# Patient Record
Sex: Female | Born: 1985 | Race: Black or African American | Hispanic: No | Marital: Single | Smoking: Never smoker
Health system: Southern US, Community
[De-identification: ages and names within clinical notes are randomized; demographics above are authoritative.]

## PROBLEM LIST (undated history)

## (undated) ENCOUNTER — Inpatient Hospital Stay (HOSPITAL_COMMUNITY): Payer: Self-pay

## (undated) DIAGNOSIS — I639 Cerebral infarction, unspecified: Secondary | ICD-10-CM

## (undated) DIAGNOSIS — Z789 Other specified health status: Secondary | ICD-10-CM

## (undated) HISTORY — PX: NO PAST SURGERIES: SHX2092

---

## 2012-08-26 ENCOUNTER — Other Ambulatory Visit: Payer: Self-pay | Admitting: Orthopedic Surgery

## 2012-08-26 DIAGNOSIS — S82102A Unspecified fracture of upper end of left tibia, initial encounter for closed fracture: Secondary | ICD-10-CM

## 2012-08-31 ENCOUNTER — Ambulatory Visit
Admission: RE | Admit: 2012-08-31 | Discharge: 2012-08-31 | Disposition: A | Discharge status: Home / Self Care | Payer: Managed Care, Other (non HMO) | Source: Ambulatory Visit | Attending: Orthopedic Surgery | Admitting: Orthopedic Surgery

## 2012-08-31 DIAGNOSIS — S82102A Unspecified fracture of upper end of left tibia, initial encounter for closed fracture: Secondary | ICD-10-CM
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
## 2014-02-10 ENCOUNTER — Emergency Department (HOSPITAL_COMMUNITY)
Admission: EM | Admit: 2014-02-10 | Discharge: 2014-02-10 | Disposition: A | Discharge status: Home / Self Care | Payer: Managed Care, Other (non HMO) | Attending: Emergency Medicine | Admitting: Emergency Medicine

## 2014-02-10 ENCOUNTER — Encounter (HOSPITAL_COMMUNITY): Payer: Self-pay | Admitting: Emergency Medicine

## 2014-02-10 ENCOUNTER — Emergency Department (HOSPITAL_COMMUNITY): Payer: Managed Care, Other (non HMO)

## 2014-02-10 DIAGNOSIS — S199XXA Unspecified injury of neck, initial encounter: Secondary | ICD-10-CM

## 2014-02-10 DIAGNOSIS — Z87828 Personal history of other (healed) physical injury and trauma: Secondary | ICD-10-CM | POA: Insufficient documentation

## 2014-02-10 DIAGNOSIS — S0993XA Unspecified injury of face, initial encounter: Secondary | ICD-10-CM | POA: Insufficient documentation

## 2014-02-10 DIAGNOSIS — Y92410 Unspecified street and highway as the place of occurrence of the external cause: Secondary | ICD-10-CM | POA: Insufficient documentation

## 2014-02-10 DIAGNOSIS — S20219A Contusion of unspecified front wall of thorax, initial encounter: Secondary | ICD-10-CM | POA: Insufficient documentation

## 2014-02-10 DIAGNOSIS — S298XXA Other specified injuries of thorax, initial encounter: Secondary | ICD-10-CM | POA: Insufficient documentation

## 2014-02-10 DIAGNOSIS — Z79899 Other long term (current) drug therapy: Secondary | ICD-10-CM | POA: Insufficient documentation

## 2014-02-10 DIAGNOSIS — T148XXA Other injury of unspecified body region, initial encounter: Secondary | ICD-10-CM

## 2014-02-10 DIAGNOSIS — Y9389 Activity, other specified: Secondary | ICD-10-CM | POA: Insufficient documentation

## 2014-02-10 DIAGNOSIS — S20212A Contusion of left front wall of thorax, initial encounter: Secondary | ICD-10-CM

## 2014-02-10 DIAGNOSIS — S8000XA Contusion of unspecified knee, initial encounter: Secondary | ICD-10-CM | POA: Insufficient documentation
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
## 2014-02-10 MED ORDER — MELOXICAM 15 MG PO TABS: 15.0000 mg | ORAL_TABLET | Freq: Every day | ORAL | Status: DC

## 2014-02-10 MED ORDER — HYDROCODONE-ACETAMINOPHEN 5-325 MG PO TABS: 1.0000 | ORAL_TABLET | ORAL | Status: DC | PRN

## 2014-02-10 MED ORDER — CYCLOBENZAPRINE HCL 10 MG PO TABS: 10.0000 mg | ORAL_TABLET | Freq: Three times a day (TID) | ORAL | Status: DC | PRN

## 2014-02-10 MED ORDER — HYDROCODONE-ACETAMINOPHEN 5-325 MG PO TABS
1.0000 | ORAL_TABLET | Freq: Once | ORAL | Status: AC
  Administered 2014-02-10: 1 via ORAL
  Filled 2014-02-10: qty 1
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
## 2014-02-10 NOTE — ED Notes (Addendum)
Patient in Xray
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
## 2014-02-11 NOTE — ED Provider Notes (Signed)
Medical screening examination/treatment/procedure(s) were performed by non-physician practitioner and as supervising physician I was immediately available for consultation/collaboration.   EKG Interpretation None       Dametra Whetsel, MD 02/11/14 0050 

## 2014-06-25 ENCOUNTER — Encounter (HOSPITAL_COMMUNITY): Payer: Self-pay | Admitting: *Deleted

## 2014-06-25 ENCOUNTER — Inpatient Hospital Stay (HOSPITAL_COMMUNITY)
Admission: AD | Admit: 2014-06-25 | Discharge: 2014-06-25 | Disposition: A | Discharge status: Home / Self Care | Payer: BC Managed Care – PPO | Source: Ambulatory Visit | Attending: Family Medicine | Admitting: Family Medicine

## 2014-06-25 ENCOUNTER — Inpatient Hospital Stay (HOSPITAL_COMMUNITY): Payer: BC Managed Care – PPO

## 2014-06-25 DIAGNOSIS — O210 Mild hyperemesis gravidarum: Secondary | ICD-10-CM | POA: Diagnosis not present

## 2014-06-25 DIAGNOSIS — R109 Unspecified abdominal pain: Secondary | ICD-10-CM

## 2014-06-25 DIAGNOSIS — N949 Unspecified condition associated with female genital organs and menstrual cycle: Secondary | ICD-10-CM | POA: Diagnosis not present

## 2014-06-25 DIAGNOSIS — O211 Hyperemesis gravidarum with metabolic disturbance: Secondary | ICD-10-CM | POA: Insufficient documentation

## 2014-06-25 DIAGNOSIS — Z3A01 Less than 8 weeks gestation of pregnancy: Secondary | ICD-10-CM | POA: Diagnosis not present

## 2014-06-25 DIAGNOSIS — E860 Dehydration: Secondary | ICD-10-CM

## 2014-06-25 DIAGNOSIS — Z3490 Encounter for supervision of normal pregnancy, unspecified, unspecified trimester: Secondary | ICD-10-CM

## 2014-06-25 HISTORY — DX: Other specified health status: Z78.9

## 2014-06-25 LAB — COMPREHENSIVE METABOLIC PANEL
ALT: 44 U/L — ABNORMAL HIGH (ref 0–35)
AST: 39 U/L — ABNORMAL HIGH (ref 0–37)
Albumin: 4.4 g/dL (ref 3.5–5.2)
Alkaline Phosphatase: 36 U/L — ABNORMAL LOW (ref 39–117)
Anion gap: 9 (ref 5–15)
BILIRUBIN TOTAL: 0.5 mg/dL (ref 0.3–1.2)
BUN: 9 mg/dL (ref 6–23)
CO2: 22 mmol/L (ref 19–32)
CREATININE: 0.62 mg/dL (ref 0.50–1.10)
Calcium: 9.8 mg/dL (ref 8.4–10.5)
Chloride: 104 mEq/L (ref 96–112)
GFR calc Af Amer: >90 mL/min (ref >90)
Glucose, Bld: 76 mg/dL (ref 70–99)
POTASSIUM: 4 mmol/L (ref 3.5–5.1)
Sodium: 135 mmol/L (ref 135–145)
Total Protein: 7.7 g/dL (ref 6.0–8.3)

## 2014-06-25 LAB — URINE MICROSCOPIC-ADD ON

## 2014-06-25 LAB — URINALYSIS, ROUTINE W REFLEX MICROSCOPIC
Glucose, UA: NEGATIVE mg/dL
HGB URINE DIPSTICK: NEGATIVE
Ketones, ur: >80 mg/dL — AB
Leukocytes, UA: NEGATIVE
Nitrite: NEGATIVE
Protein, ur: 30 mg/dL — AB
Urobilinogen, UA: 1 mg/dL (ref 0.0–1.0)
pH: 6 (ref 5.0–8.0)

## 2014-06-25 LAB — CBC
HEMATOCRIT: 40.9 % (ref 36.0–46.0)
Hemoglobin: 13.7 g/dL (ref 12.0–15.0)
MCH: 28.2 pg (ref 26.0–34.0)
MCHC: 33.5 g/dL (ref 30.0–36.0)
MCV: 84.2 fL (ref 78.0–100.0)
PLATELETS: 223 10*3/uL (ref 150–400)
RBC: 4.86 MIL/uL (ref 3.87–5.11)
RDW: 12.2 % (ref 11.5–15.5)
WBC: 6.1 10*3/uL (ref 4.0–10.5)

## 2014-06-25 LAB — POCT PREGNANCY, URINE: PREG TEST UR: POSITIVE — AB

## 2014-06-25 IMAGING — US US OB TRANSVAGINAL
1 series · 14 of 28 positions shown · non-contrast
Comparison: None.

CLINICAL DATA: Right-sided abdominal pain

EXAM:
OBSTETRIC <14 WK US AND TRANSVAGINAL OB US
TECHNIQUE: Both transabdominal and transvaginal ultrasound examinations were
performed for complete evaluation of the gestation as well as the
maternal uterus, adnexal regions, and pelvic cul-de-sac.
Transvaginal technique was performed to assess early pregnancy.

[Series 1: us ob comp less 14 wks · 44 acquisitions, 14 frames shown]
[im 2/44]
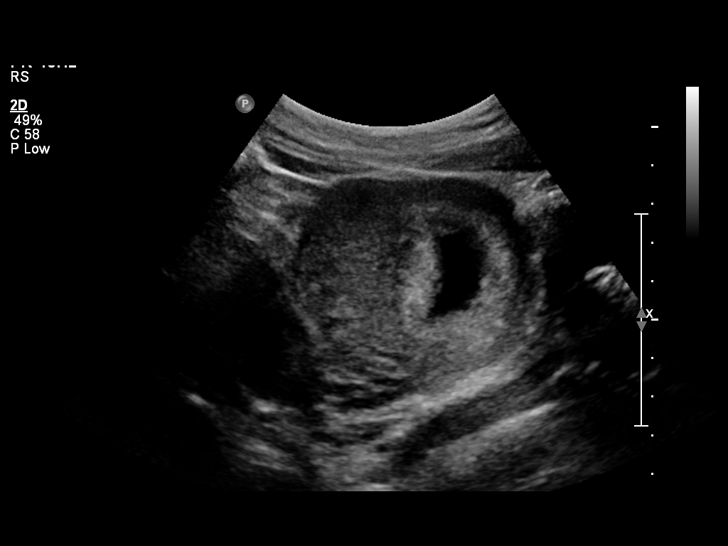
[im 5/44]
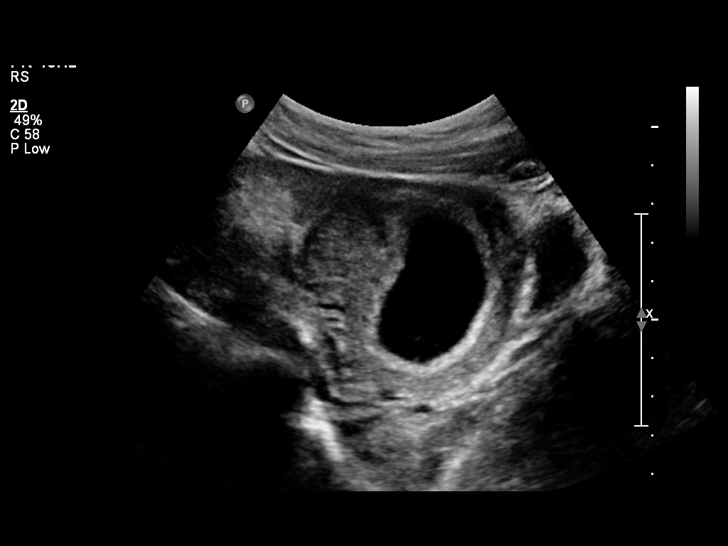
[im 8/44]
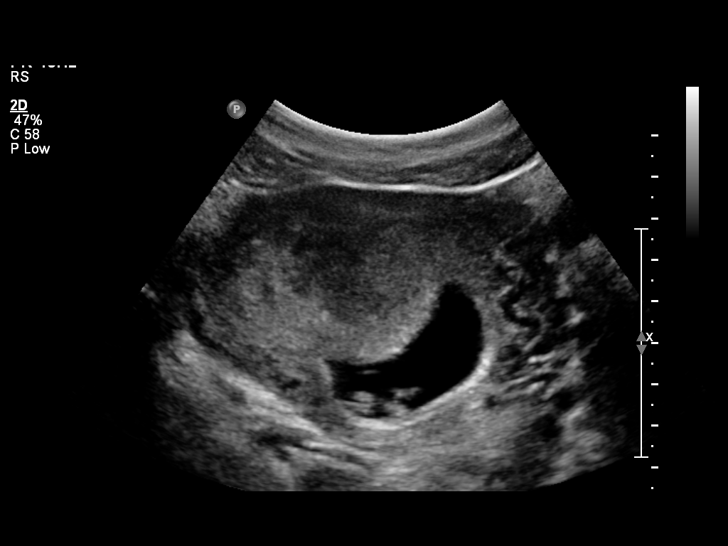
[im 12/44]
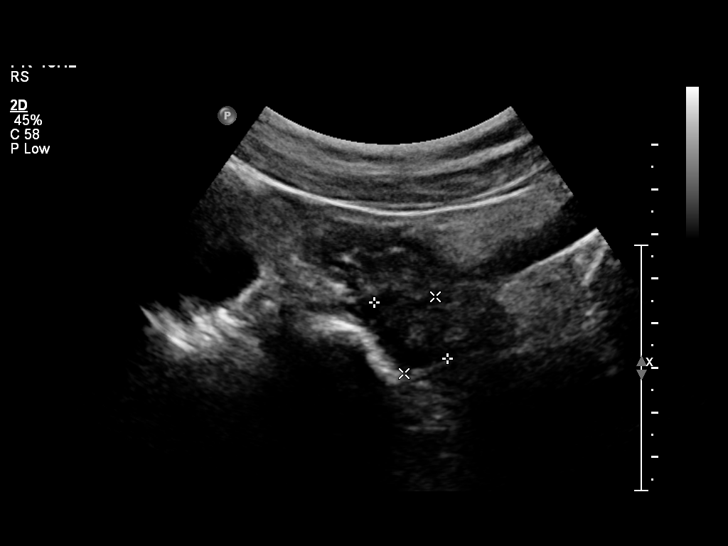
[im 15/44]
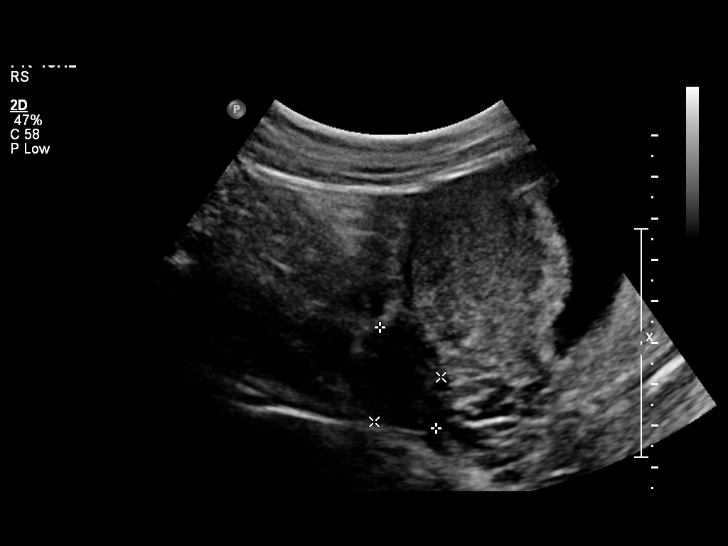
[im 18/44]
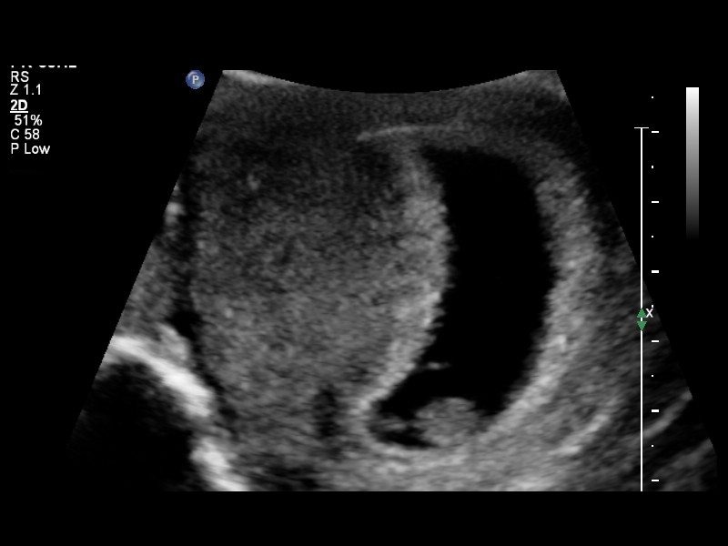
[im 21/44]
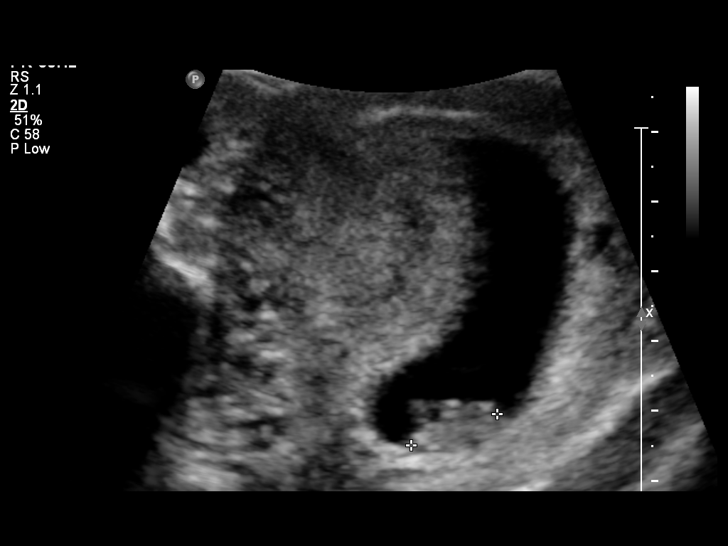
[im 24/44]
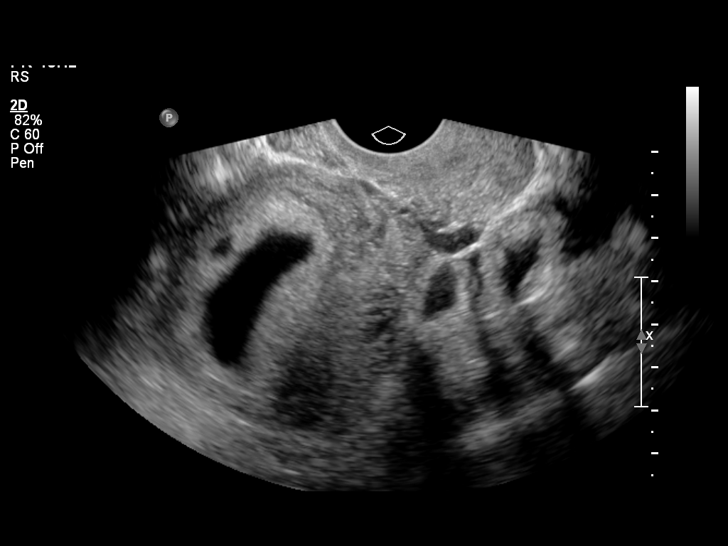
[im 28/44]
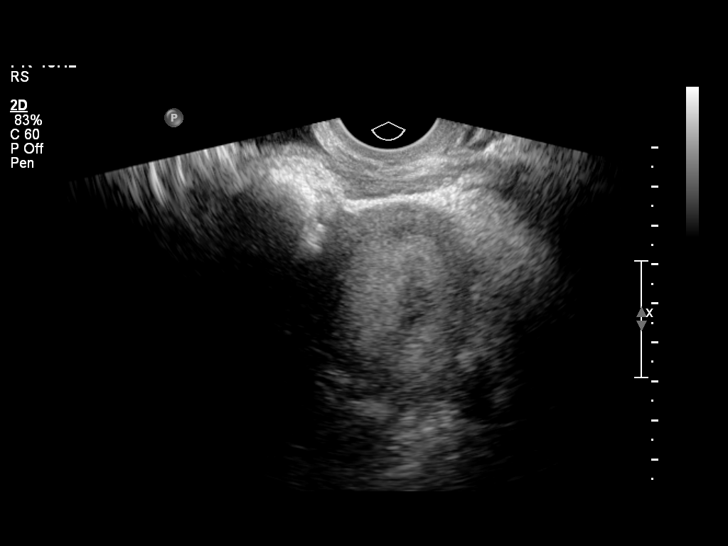
[im 31/44]
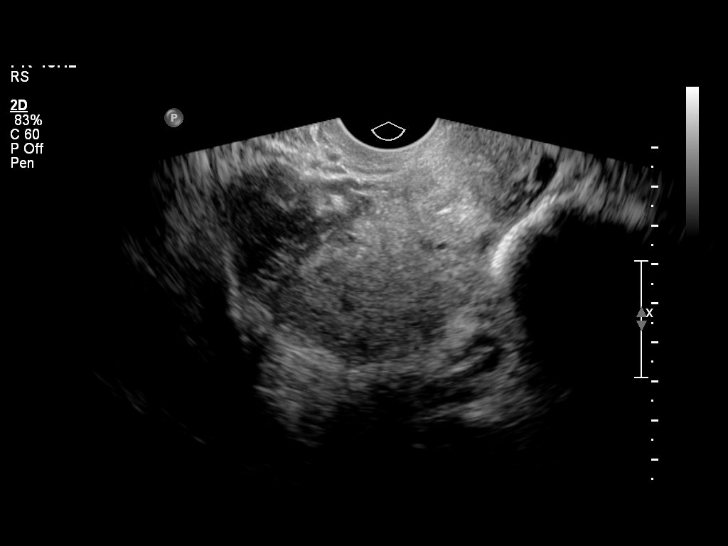
[im 34/44]
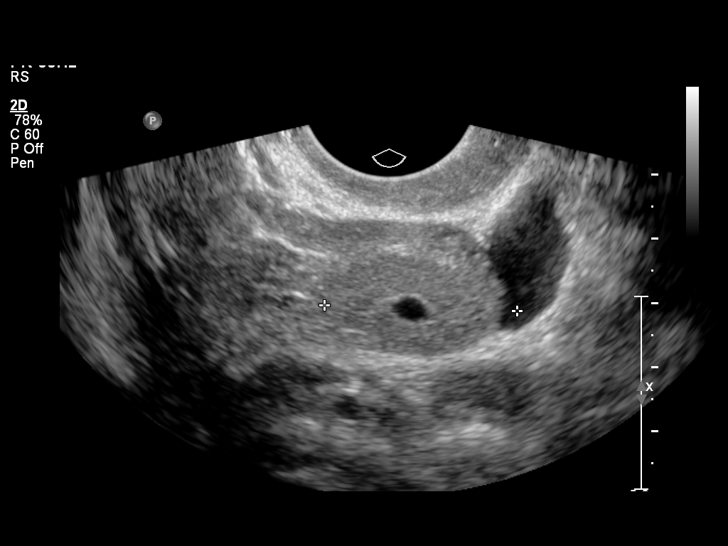
[im 37/44]
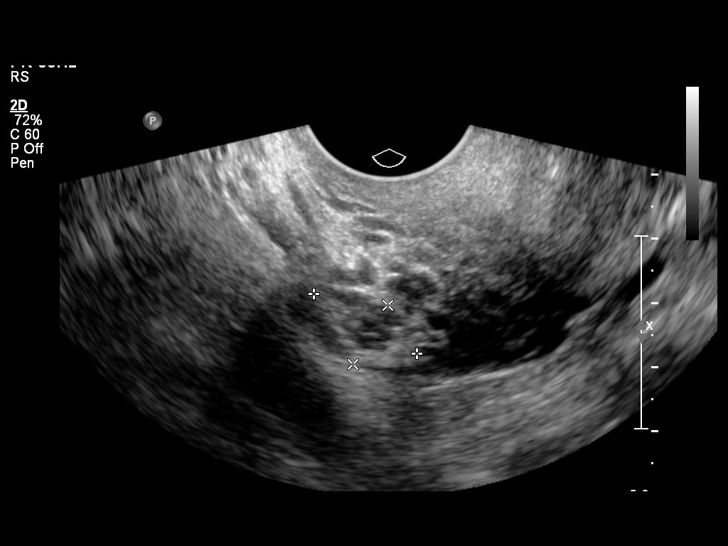
[im 40/44]
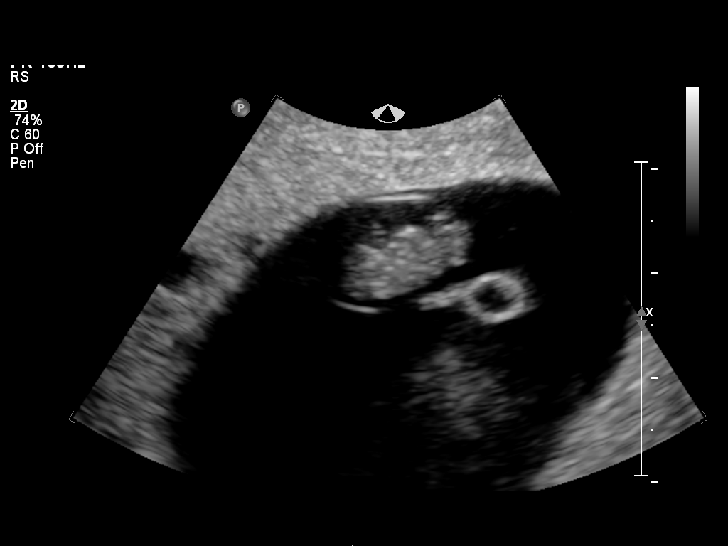
[im 44/44]
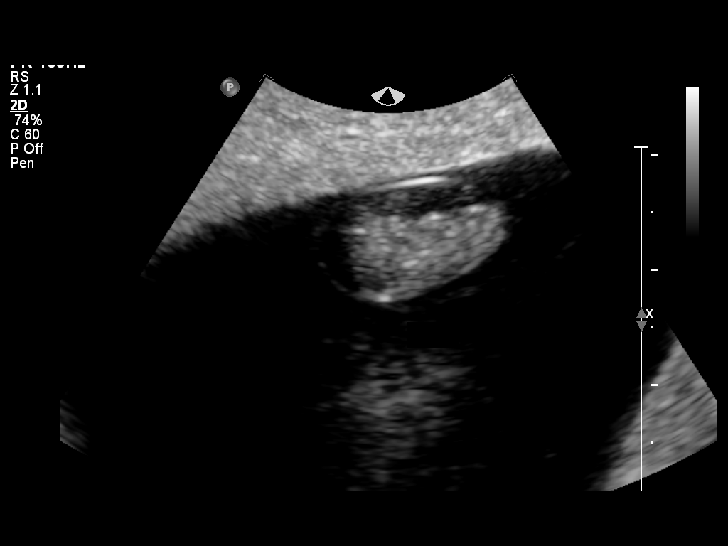

[14 of 28 positions shown; findings below may reference images not displayed]

FINDINGS: Intrauterine gestational sac: Visualized/normal in shape.

Yolk sac:  Present

Embryo:  Present

Cardiac Activity: Present

Heart Rate:  142 bpm

CRL:   14  mm   7 w 5d                  US EDC: [DATE]

Maternal uterus/adnexae: There is a small subchorionic hemorrhage.
There is no adnexal mass. Bilateral ovaries are normal in size and
echogenicity. The right ovary measures 3 x 2.9 x 2.1 cm. The left
ovary measures 1.9 x 1.1 x 1.7 cm. There is a trace amount of pelvic
free fluid.
IMPRESSION: 1. Single live intrauterine pregnancy dating 7 weeks 5 days with an
ultrasound EDC of [DATE] based on the current ultrasound.
2. Small subchorionic hemorrhage.

## 2014-06-25 MED ORDER — PROMETHAZINE HCL 25 MG RE SUPP: 25.0000 mg | Freq: Four times a day (QID) | RECTAL | Status: DC | PRN

## 2014-06-25 MED ORDER — FAMOTIDINE IN NACL 20-0.9 MG/50ML-% IV SOLN
20.0000 mg | Freq: Once | INTRAVENOUS | Status: AC
  Administered 2014-06-25: 20 mg via INTRAVENOUS
  Filled 2014-06-25: qty 50

## 2014-06-25 MED ORDER — LACTATED RINGERS IV SOLN
Freq: Once | INTRAVENOUS | Status: AC
  Administered 2014-06-25: 11:00:00 via INTRAVENOUS
  Filled 2014-06-25: qty 1000

## 2014-06-25 MED ORDER — METOCLOPRAMIDE HCL 5 MG/ML IJ SOLN
10.0000 mg | Freq: Once | INTRAMUSCULAR | Status: AC
  Administered 2014-06-25: 10 mg via INTRAVENOUS
  Filled 2014-06-25: qty 2

## 2014-06-25 MED ORDER — PROMETHAZINE HCL 25 MG/ML IJ SOLN
Freq: Once | INTRAVENOUS | Status: AC
  Administered 2014-06-25: 10:00:00 via INTRAVENOUS
  Filled 2014-06-25: qty 1000

## 2014-06-25 MED ORDER — LACTATED RINGERS IV BOLUS (SEPSIS)
1000.0000 mL | Freq: Once | INTRAVENOUS | Status: AC
  Administered 2014-06-25: 1000 mL via INTRAVENOUS

## 2014-06-25 NOTE — MAU Note (Signed)
Ongoing problem with nausea.  Has taken  Phenergan, zofran and reglan; nothing is working.  Some spotting last wk.  Has had some pain at times in RLQ- "ovary is enlarged, ? Ectopic, [place]"

## 2014-06-25 NOTE — MAU Provider Note (Signed)
History     CSN: [id number]  Arrival date and time: 06/25/14 [id number]   First Provider Initiated Contact with Patient 06/25/14 0930      Chief Complaint  Patient presents with  . Morning Sickness   HPI Comments: Shannon Primeekecia Fleming 28 y.o. [id number] presents to MAU with nausea, vomiting and 6 lb weight loss. [place] She in fact has an appointment today but did not think she could wait. She has Zofran, phenergan and Reglan but is unable to hold down her medications. She last took Reglan on Saturday.   Her second issue is of possible Right ectopic. She was told she has IUGS and some issue with the right ovary. She has an appointment today, but [place] [facility] and would like ultrasound done here    Past Medical History  Diagnosis Date  . Medical history non-contributory     Past Surgical History  Procedure Laterality Date  . No past surgeries      History reviewed. No pertinent family history.  History  Substance Use Topics  . Smoking status: Never Smoker   . Smokeless tobacco: Never Used  . Alcohol Use: No    Allergies: No Known Allergies  Prescriptions prior to admission  Medication Sig Dispense Refill Last Dose  . metoCLOPramide (REGLAN) 10 MG tablet Take 10 mg by mouth every 6 (six) hours as needed for nausea or vomiting.   06/23/2014  . ondansetron (ZOFRAN) 4 MG tablet Take 4 mg by mouth every 8 (eight) hours as needed for nausea or vomiting.   06/18/2014  . promethazine (PHENERGAN) 25 MG tablet Take 25 mg by mouth every 6 (six) hours as needed for nausea or vomiting.   Past Week at Unknown time  . cyclobenzaprine (FLEXERIL) 10 MG tablet Take 1 tablet (10 mg total) by mouth 3 (three) times daily as needed for muscle spasms. (Patient not taking: Reported on 06/25/2014) 30 tablet 0   . HYDROcodone-acetaminophen (NORCO/VICODIN) 5-325 MG per tablet Take 1 tablet by mouth every 4 (four) hours as needed for  moderate pain. (Patient not taking: Reported on 06/25/2014) 10 tablet 0   . meloxicam (MOBIC) 15 MG tablet Take 1 tablet (15 mg total) by mouth daily. (Patient not taking: Reported on 06/25/2014) 30 tablet 0     Review of Systems  Constitutional: Negative.   HENT: Negative.   Eyes: Negative.   Respiratory: Negative.   Cardiovascular: Negative.   Gastrointestinal: Positive for nausea and vomiting.  Genitourinary: Negative.   Musculoskeletal: Negative.   Skin: Negative.   Neurological: Negative.   Psychiatric/Behavioral: Negative.    Physical Exam   Blood pressure 124/82, pulse 99, temperature 98.3 F (36.8 C), temperature source Oral, resp. rate 16, height 5' 2.75" (1.594 m), weight 67.132 kg (148 lb), last menstrual period 05/03/2014.  Physical Exam  Constitutional: She is oriented to person, place, and time. She appears well-developed and well-nourished. No distress.  HENT:  Head: Normocephalic and atraumatic.  Cardiovascular: Normal rate, regular rhythm and normal heart sounds.   Respiratory: Effort normal and breath sounds normal.  GI: Soft. Bowel sounds are normal. She exhibits no distension. There is no tenderness. There is no rebound.  Musculoskeletal: Normal range of motion.  Neurological: She is alert and oriented to person, place, and time.  Skin: Skin is warm and dry.  Psychiatric: She has a normal mood and affect. Her behavior is normal. Thought content normal.   Results for orders placed  or performed during the hospital encounter of 06/25/14 (from the past 24 hour(s))  Urinalysis, Routine w reflex microscopic     Status: Abnormal   Collection Time: 06/25/14  9:08 AM  Result Value Ref Range   Color, Urine YELLOW YELLOW   APPearance CLEAR CLEAR   Specific Gravity, Urine >1.030 (H) 1.005 - 1.030   pH 6.0 5.0 - 8.0   Glucose, UA NEGATIVE NEGATIVE mg/dL   Hgb urine dipstick NEGATIVE NEGATIVE   Bilirubin Urine SMALL (A) NEGATIVE   Ketones, ur >80 (A) NEGATIVE mg/dL    Protein, ur 30 (A) NEGATIVE mg/dL   Urobilinogen, UA 1.0 0.0 - 1.0 mg/dL   Nitrite NEGATIVE NEGATIVE   Leukocytes, UA NEGATIVE NEGATIVE  Urine microscopic-add on     Status: Abnormal   Collection Time: 06/25/14  9:08 AM  Result Value Ref Range   Squamous Epithelial / LPF FEW (A) RARE   WBC, UA 3-6 <3 WBC/hpf   RBC / HPF 0-2 <3 RBC/hpf   Bacteria, UA MANY (A) RARE   Urine-Other MUCOUS PRESENT   Pregnancy, urine POC     Status: Abnormal   Collection Time: 06/25/14  9:10 AM  Result Value Ref Range   Preg Test, Ur POSITIVE (A) NEGATIVE  CBC     Status: None   Collection Time: 06/25/14  9:40 AM  Result Value Ref Range   WBC 6.1 4.0 - 10.5 K/uL   RBC 4.86 3.87 - 5.11 MIL/uL   Hemoglobin 13.7 12.0 - 15.0 g/dL   HCT 16.1 09.6 - 04.5 %   MCV 84.2 78.0 - 100.0 fL   MCH 28.2 26.0 - 34.0 pg   MCHC 33.5 30.0 - 36.0 g/dL   RDW 40.9 81.1 - 91.4 %   Platelets 223 150 - 400 K/uL  Comprehensive metabolic panel     Status: Abnormal   Collection Time: 06/25/14  9:40 AM  Result Value Ref Range   Sodium 135 135 - 145 mmol/L   Potassium 4.0 3.5 - 5.1 mmol/L   Chloride 104 96 - 112 mEq/L   CO2 22 19 - 32 mmol/L   Glucose, Bld 76 70 - 99 mg/dL   BUN 9 6 - 23 mg/dL   Creatinine, Ser 7.82 0.50 - 1.10 mg/dL   Calcium 9.8 8.4 - 95.6 mg/dL   Total Protein 7.7 6.0 - 8.3 g/dL   Albumin 4.4 3.5 - 5.2 g/dL   AST 39 (H) 0 - 37 U/L   ALT 44 (H) 0 - 35 U/L   Alkaline Phosphatase 36 (L) 39 - 117 U/L   Total Bilirubin 0.5 0.3 - 1.2 mg/dL   GFR calc non Af Amer >90 >90 mL/min   GFR calc Af Amer >90 >90 mL/min   Anion gap 9 5 - 15  . US Ob Comp Less 14 Wks  06/25/2014   CLINICAL DATA:  Right-sided abdominal pain  EXAM: OBSTETRIC <14 WK [place] AND TRANSVAGINAL OB US  TECHNIQUE: Both transabdominal and transvaginal ultrasound examinations were performed for complete evaluation of the gestation as well as the maternal uterus, adnexal regions, and pelvic cul-de-sac. Transvaginal technique was performed to  assess early pregnancy.  COMPARISON:  None.  FINDINGS: Intrauterine gestational sac: Visualized/normal in shape.  Yolk sac:  Present  Embryo:  Present  Cardiac Activity: Present  Heart Rate:  142 bpm  CRL:   14  mm   7 w 5d  US EDC: 02/06/2015  Maternal uterus/adnexae: There is a small subchorionic hemorrhage. There is no adnexal mass. Bilateral ovaries are normal in size and echogenicity. The right ovary measures 3 x 2.9 x 2.1 cm. The left ovary measures 1.9 x 1.1 x 1.7 cm. There is a trace amount of pelvic free fluid.  IMPRESSION: 1. Single live intrauterine pregnancy dating 7 weeks 5 days with an ultrasound EDC of 02/06/2015 based on the current ultrasound. 2. Small subchorionic hemorrhage.   Electronically Signed   By: Elige KoHetal  Patel   On: 06/25/2014 12:22   [place] Ob Transvaginal  06/25/2014   CLINICAL DATA:  Right-sided abdominal pain  EXAM: OBSTETRIC <14 WK US AND TRANSVAGINAL OB US  TECHNIQUE: Both transabdominal and transvaginal ultrasound examinations were performed for complete evaluation of the gestation as well as the maternal uterus, adnexal regions, and pelvic cul-de-sac. Transvaginal technique was performed to assess early pregnancy.  COMPARISON:  None.  FINDINGS: Intrauterine gestational sac: Visualized/normal in shape.  Yolk sac:  Present  Embryo:  Present  Cardiac Activity: Present  Heart Rate:  142 bpm  CRL:   14  mm   7 w 5d                  US EDC: 02/06/2015  Maternal uterus/adnexae: There is a small subchorionic hemorrhage. There is no adnexal mass. Bilateral ovaries are normal in size and echogenicity. The right ovary measures 3 x 2.9 x 2.1 cm. The left ovary measures 1.9 x 1.1 x 1.7 cm. There is a trace amount of pelvic free fluid.  IMPRESSION: 1. Single live intrauterine pregnancy dating 7 weeks 5 days with an ultrasound EDC of 02/06/2015 based on the current ultrasound. 2. Small subchorionic hemorrhage.   Electronically Signed   By: Elige KoHetal  Patel   On: 06/25/2014 12:22     . MAU Course  Procedures  MDM  IVLR with phenergan 25 mg  IVLR with MVI/ patient vomited after trying sips of fluids IV LR with Reglan 10 mg IVPB Pepcid 20 mg IVPB CBC, CMET, urine culture U/S less than 14 weeks to rule out ectopic Assessment and Plan   A: Nausea and vomiting in  Pregnancy Dehydration Right pelvic Pains  P: Above orders Phenergan suppositories Advised pepcid OTC Given [place] providers [place] Return to MAU as needed  Carolynn ServeBarefoot, Antolin Belsito Miller 06/25/2014, 12:49 PM

## 2014-06-25 NOTE — Discharge Instructions (Signed)
Morning Sickness Morning sickness is when you feel sick to your stomach (nauseous) during pregnancy. This nauseous feeling may or may not come with vomiting. It often occurs in the morning but can be a problem any time of day. Morning sickness is most common during the first trimester, but it may continue throughout pregnancy. While morning sickness is unpleasant, it is usually harmless unless you develop severe and continual vomiting (hyperemesis gravidarum). This condition requires more intense treatment.  CAUSES  The cause of morning sickness is not completely known but seems to be related to normal hormonal changes that occur in pregnancy. RISK FACTORS You are at greater risk if you:  Experienced nausea or vomiting before your pregnancy.  Had morning sickness during a previous pregnancy.  Are pregnant with more than one baby, such as twins. TREATMENT  Do not use any medicines (prescription, over-the-counter, or herbal) for morning sickness without first talking to your health care provider. Your health care provider may prescribe or recommend:  Vitamin B6 supplements.  Anti-nausea medicines.  The herbal medicine ginger. HOME CARE INSTRUCTIONS   Only take over-the-counter or prescription medicines as directed by your health care provider.  Taking multivitamins before getting pregnant can prevent or decrease the severity of morning sickness in most women.  Eat a piece of dry toast or unsalted crackers before getting out of bed in the morning.  Eat five or six small meals a day.  Eat dry and bland foods (rice, baked potato). Foods high in carbohydrates are often helpful.  Do not drink liquids with your meals. Drink liquids between meals.  Avoid greasy, fatty, and spicy foods.  Get someone to cook for you if the smell of any food causes nausea and vomiting.  If you feel nauseous after taking prenatal vitamins, take the vitamins at night or with a snack.  Snack on protein  foods (nuts, yogurt, cheese) between meals if you are hungry.  Eat unsweetened gelatins for desserts.  Wearing an acupressure wristband (worn for sea sickness) may be helpful.  Acupuncture may be helpful.  Do not smoke.  Get a humidifier to keep the air in your house free of odors.  Get plenty of fresh air. SEEK MEDICAL CARE IF:   Your home remedies are not working, and you need medicine.  You feel dizzy or lightheaded.  You are losing weight. SEEK IMMEDIATE MEDICAL CARE IF:   You have persistent and uncontrolled nausea and vomiting.  You pass out (faint). MAKE SURE YOU:  Understand these instructions.  Will watch your condition.  Will get help right away if you are not doing well or get worse. Document Released: 08/06/2006 Document Revised: 06/20/2013 Document Reviewed: 11/30/2012 [facility] Patient Information 2015 [place] [place]. This information is not intended to replace advice given to you by your health care provider. Make sure you discuss any questions you have with your health care provider. Food Choices to Help Relieve Diarrhea When you have diarrhea, the foods you eat and your eating habits are very important. Choosing the right foods and drinks can help relieve diarrhea. Also, because diarrhea can last up to 7 days, you need to replace lost fluids and electrolytes (such as sodium, potassium, and chloride) in order to help prevent dehydration.  WHAT GENERAL GUIDELINES DO I NEED TO FOLLOW?  Slowly drink 1 cup (8 oz) of fluid for each episode of diarrhea. If you are getting enough fluid, your urine will be clear or pale yellow.  Eat starchy foods. Some good choices include  white rice, white toast, pasta, low-fiber cereal, baked potatoes (without the skin), saltine crackers, and bagels.  Avoid large servings of any cooked vegetables.  Limit fruit to two servings per day. A serving is  cup or 1 small piece.  Choose foods with less than 2 g of fiber per  serving.  Limit fats to less than 8 tsp (38 g) per day.  Avoid fried foods.  Eat foods that have probiotics in them. Probiotics can be found in certain dairy products.  Avoid foods and beverages that may increase the speed at which food moves through the stomach and intestines (gastrointestinal tract). Things to avoid include:  High-fiber foods, such as dried fruit, raw fruits and vegetables, nuts, seeds, and whole grain foods.  Spicy foods and high-fat foods.  Foods and beverages sweetened with high-fructose corn syrup, honey, or sugar alcohols such as xylitol, sorbitol, and mannitol. WHAT FOODS ARE RECOMMENDED? Grains White rice. White, [place], or pita breads (fresh or toasted), including plain rolls, buns, or bagels. White pasta. Saltine, soda, or graham crackers. Pretzels. Low-fiber cereal. Cooked cereals made with water (such as cornmeal, farina, or cream cereals). Plain muffins. Matzo. Melba toast. Zwieback.  Vegetables Potatoes (without the skin). Strained tomato and vegetable juices. Most well-cooked and canned vegetables without seeds. Tender lettuce. Fruits Cooked or canned applesauce, apricots, cherries, fruit cocktail, grapefruit, peaches, pears, or plums. Fresh bananas, apples without skin, cherries, grapes, cantaloupe, grapefruit, peaches, oranges, or plums.  Meat and Other Protein Products Baked or boiled chicken. Eggs. Tofu. Fish. Seafood. Smooth peanut butter. Ground or well-cooked tender beef, ham, veal, lamb, pork, or poultry.  Dairy Plain yogurt, kefir, and unsweetened liquid yogurt. Lactose-free milk, buttermilk, or soy milk. Plain hard cheese. Beverages Sport drinks. Clear broths. Diluted fruit juices (except prune). Regular, caffeine-free sodas such as ginger ale. Water. Decaffeinated teas. Oral rehydration solutions. Sugar-free beverages not sweetened with sugar alcohols. Other Bouillon, broth, or soups made from recommended foods.  The items listed above may not  be a complete list of recommended foods or beverages. Contact your dietitian for more options. WHAT FOODS ARE NOT RECOMMENDED? Grains Whole grain, whole wheat, bran, or rye breads, rolls, pastas, crackers, and cereals. Wild or brown rice. Cereals that contain more than 2 g of fiber per serving. Corn tortillas or taco shells. Cooked or dry oatmeal. Granola. Popcorn. Vegetables Raw vegetables. Cabbage, broccoli, Brussels sprouts, artichokes, baked beans, beet greens, corn, kale, legumes, peas, sweet potatoes, and yams. Potato skins. Cooked spinach and cabbage. Fruits Dried fruit, including raisins and dates. Raw fruits. Stewed or dried prunes. Fresh apples with skin, apricots, mangoes, pears, raspberries, and strawberries.  Meat and Other Protein Products Chunky peanut butter. Nuts and seeds. Beans and lentils. Tomasa BlaseBacon.  Dairy High-fat cheeses. Milk, chocolate milk, and beverages made with milk, such as milk shakes. Cream. Ice cream. Sweets and Desserts Sweet rolls, doughnuts, and sweet breads. Pancakes and waffles. Fats and Oils Butter. Cream sauces. Margarine. Salad oils. Plain salad dressings. Olives. Avocados.  Beverages Caffeinated beverages (such as coffee, tea, soda, or energy drinks). Alcoholic beverages. Fruit juices with pulp. Prune juice. Soft drinks sweetened with high-fructose corn syrup or sugar alcohols. Other Coconut. Hot sauce. Chili powder. Mayonnaise. Gravy. Cream-based or milk-based soups.  The items listed above may not be a complete list of foods and beverages to avoid. Contact your dietitian for more information. WHAT SHOULD I DO IF I BECOME DEHYDRATED? Diarrhea can sometimes lead to dehydration. Signs of dehydration include dark urine and dry mouth  and skin. If you think you are dehydrated, you should rehydrate with an oral rehydration solution. These solutions can be purchased at pharmacies, retail stores, or online.  Drink -1 cup (120-240 mL) of oral rehydration solution  each time you have an episode of diarrhea. If drinking this amount makes your diarrhea worse, try drinking smaller amounts more often. For example, drink 1-3 tsp (5-15 mL) every 5-10 minutes.  A general rule for staying hydrated is to drink 1-2 L of fluid per day. Talk to your health care provider about the specific amount you should be drinking each day. Drink enough fluids to keep your urine clear or pale yellow. Document Released: 09/05/2003 Document Revised: 06/20/2013 Document Reviewed: 05/08/2013 [facility] Patient Information 2015 [place], [place]. This information is not intended to replace advice given to you by your health care provider. Make sure you discuss any questions you have with your health care provider.

## 2014-06-27 LAB — CULTURE, OB URINE: SPECIAL REQUESTS: NORMAL

## 2014-06-28 ENCOUNTER — Other Ambulatory Visit (HOSPITAL_COMMUNITY): Payer: Self-pay | Admitting: Advanced Practice Midwife

## 2014-06-28 MED ORDER — CEPHALEXIN 500 MG PO CAPS: 500.0000 mg | ORAL_CAPSULE | Freq: Four times a day (QID) | ORAL | Status: DC

## 2014-06-28 NOTE — MAU Provider Note (Signed)
Telephone call placed to notify her of + urine culture  No answer.  Left message to call us back  Rx Keflex sent to pharmacy on file. Aviva SignsMarie L Harumi Yamin, CNM

## 2015-04-30 ENCOUNTER — Encounter (HOSPITAL_COMMUNITY): Payer: Self-pay | Admitting: *Deleted

## 2021-09-11 ENCOUNTER — Emergency Department (HOSPITAL_COMMUNITY): Payer: Medicaid - Out of State

## 2021-09-11 ENCOUNTER — Observation Stay (HOSPITAL_COMMUNITY)
Admission: EM | Admit: 2021-09-11 | Discharge: 2021-09-12 | Disposition: A | Discharge status: Home / Self Care | Payer: Medicaid - Out of State | Attending: Internal Medicine | Admitting: Internal Medicine

## 2021-09-11 ENCOUNTER — Encounter (HOSPITAL_COMMUNITY): Payer: Self-pay | Admitting: *Deleted

## 2021-09-11 DIAGNOSIS — Z20822 Contact with and (suspected) exposure to covid-19: Secondary | ICD-10-CM | POA: Diagnosis not present

## 2021-09-11 DIAGNOSIS — R2990 Unspecified symptoms and signs involving the nervous system: Secondary | ICD-10-CM | POA: Diagnosis not present

## 2021-09-11 DIAGNOSIS — Z8673 Personal history of transient ischemic attack (TIA), and cerebral infarction without residual deficits: Secondary | ICD-10-CM | POA: Diagnosis not present

## 2021-09-11 DIAGNOSIS — R531 Weakness: Secondary | ICD-10-CM | POA: Insufficient documentation

## 2021-09-11 DIAGNOSIS — R29898 Other symptoms and signs involving the musculoskeletal system: Secondary | ICD-10-CM

## 2021-09-11 DIAGNOSIS — R29818 Other symptoms and signs involving the nervous system: Secondary | ICD-10-CM | POA: Diagnosis not present

## 2021-09-11 DIAGNOSIS — W1830XA Fall on same level, unspecified, initial encounter: Secondary | ICD-10-CM | POA: Insufficient documentation

## 2021-09-11 DIAGNOSIS — Z7982 Long term (current) use of aspirin: Secondary | ICD-10-CM | POA: Diagnosis not present

## 2021-09-11 DIAGNOSIS — H538 Other visual disturbances: Secondary | ICD-10-CM | POA: Insufficient documentation

## 2021-09-11 DIAGNOSIS — R202 Paresthesia of skin: Secondary | ICD-10-CM | POA: Diagnosis present

## 2021-09-11 HISTORY — DX: Cerebral infarction, unspecified: I63.9

## 2021-09-11 LAB — CBC
HCT: 41 % (ref 36.0–46.0)
Hemoglobin: 13 g/dL (ref 12.0–15.0)
MCH: 28.1 pg (ref 26.0–34.0)
MCHC: 31.7 g/dL (ref 30.0–36.0)
MCV: 88.7 fL (ref 80.0–100.0)
Platelets: 271 10*3/uL (ref 150–400)
RBC: 4.62 MIL/uL (ref 3.87–5.11)
RDW: 13.2 % (ref 11.5–15.5)
WBC: 6.3 10*3/uL (ref 4.0–10.5)
nRBC: 0 % (ref 0.0–0.2)

## 2021-09-11 LAB — CBG MONITORING, ED: Glucose-Capillary: 81 mg/dL (ref 70–99)

## 2021-09-11 LAB — DIFFERENTIAL
Abs Immature Granulocytes: 0.01 10*3/uL (ref 0.00–0.07)
Basophils Absolute: 0 10*3/uL (ref 0.0–0.1)
Basophils Relative: 0 %
Eosinophils Absolute: 0.1 10*3/uL (ref 0.0–0.5)
Eosinophils Relative: 1 %
Immature Granulocytes: 0 %
Lymphocytes Relative: 33 %
Lymphs Abs: 2 10*3/uL (ref 0.7–4.0)
Monocytes Absolute: 0.4 10*3/uL (ref 0.1–1.0)
Monocytes Relative: 7 %
Neutro Abs: 3.7 10*3/uL (ref 1.7–7.7)
Neutrophils Relative %: 59 %

## 2021-09-11 LAB — I-STAT CHEM 8, ED
BUN: 9 mg/dL (ref 6–20)
Calcium, Ion: 1.26 mmol/L (ref 1.15–1.40)
Chloride: 107 mmol/L (ref 98–111)
Creatinine, Ser: 0.9 mg/dL (ref 0.44–1.00)
Glucose, Bld: 87 mg/dL (ref 70–99)
HCT: 40 % (ref 36.0–46.0)
Hemoglobin: 13.6 g/dL (ref 12.0–15.0)
Potassium: 4.1 mmol/L (ref 3.5–5.1)
Sodium: 141 mmol/L (ref 135–145)
TCO2: 24 mmol/L (ref 22–32)

## 2021-09-11 LAB — RAPID URINE DRUG SCREEN, HOSP PERFORMED
Amphetamines: NOT DETECTED
Barbiturates: NOT DETECTED
Benzodiazepines: NOT DETECTED
Cocaine: NOT DETECTED
Opiates: NOT DETECTED
Tetrahydrocannabinol: NOT DETECTED

## 2021-09-11 LAB — URINALYSIS, ROUTINE W REFLEX MICROSCOPIC
Bilirubin Urine: NEGATIVE
Glucose, UA: NEGATIVE mg/dL
Hgb urine dipstick: NEGATIVE
Ketones, ur: NEGATIVE mg/dL
Leukocytes,Ua: NEGATIVE
Nitrite: NEGATIVE
Protein, ur: NEGATIVE mg/dL
Specific Gravity, Urine: 1.031 — ABNORMAL HIGH (ref 1.005–1.030)
pH: 6 (ref 5.0–8.0)

## 2021-09-11 LAB — COMPREHENSIVE METABOLIC PANEL
ALT: 12 U/L (ref 0–44)
AST: 19 U/L (ref 15–41)
Albumin: 4.5 g/dL (ref 3.5–5.0)
Alkaline Phosphatase: 41 U/L (ref 38–126)
Anion gap: 8 (ref 5–15)
BUN: 10 mg/dL (ref 6–20)
CO2: 23 mmol/L (ref 22–32)
Calcium: 9.4 mg/dL (ref 8.9–10.3)
Chloride: 108 mmol/L (ref 98–111)
Creatinine, Ser: 0.94 mg/dL (ref 0.44–1.00)
GFR, Estimated: >60 mL/min (ref >60)
Glucose, Bld: 90 mg/dL (ref 70–99)
Potassium: 4 mmol/L (ref 3.5–5.1)
Sodium: 139 mmol/L (ref 135–145)
Total Bilirubin: 0.4 mg/dL (ref 0.3–1.2)
Total Protein: 7.8 g/dL (ref 6.5–8.1)

## 2021-09-11 LAB — RESP PANEL BY RT-PCR (FLU A&B, COVID) ARPGX2
Influenza A by PCR: NEGATIVE
Influenza B by PCR: NEGATIVE
SARS Coronavirus 2 by RT PCR: NEGATIVE

## 2021-09-11 LAB — POC URINE PREG, ED: Preg Test, Ur: NEGATIVE

## 2021-09-11 LAB — PROTIME-INR
INR: 1 (ref 0.8–1.2)
Prothrombin Time: 13 seconds (ref 11.4–15.2)

## 2021-09-11 LAB — ETHANOL: Alcohol, Ethyl (B): <10 mg/dL (ref <10)

## 2021-09-11 LAB — APTT: aPTT: 29 seconds (ref 24–36)

## 2021-09-11 IMAGING — MR MR HEAD W/O CM
11 of 12 series · 42 of 48 positions shown · non-contrast
Comparison: No prior MRI, correlation is made with CT head and CTA
head neck [DATE]

CLINICAL DATA: Left-sided weakness, stroke suspected, headache and
blurry vision in right eye

EXAM:
MRI HEAD WITHOUT CONTRAST
TECHNIQUE: Multiplanar, multiecho pulse sequences of the brain and surrounding
structures were obtained without intravenous contrast.

[Series 5: DWI · axial · 4.0mm · 0.88mm/px · z∈[-73,+58]mm · 4 of 36 slices shown (1 of 6)]
[im 1/36]
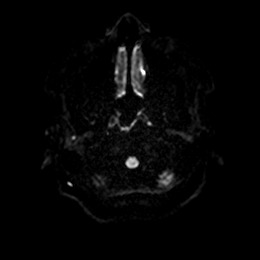
[im 12/36]
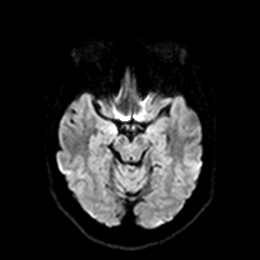
[im 24/36]
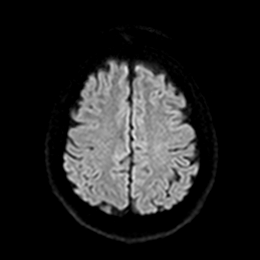
[im 36/36]
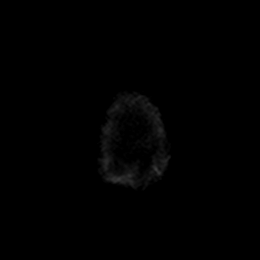

[Series 5: DWI · axial · 4.0mm · 0.88mm/px · z∈[-73,+58]mm · 4 of 36 slices shown (2 of 6)]
[im 1/36]
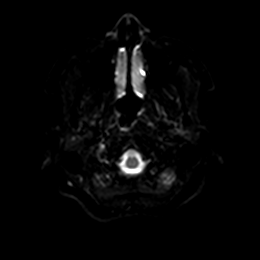
[im 12/36]
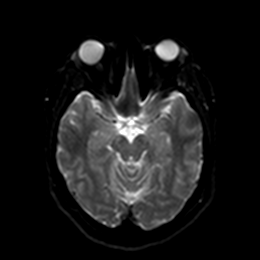
[im 24/36]
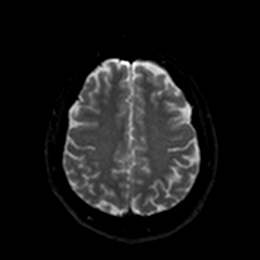
[im 36/36]
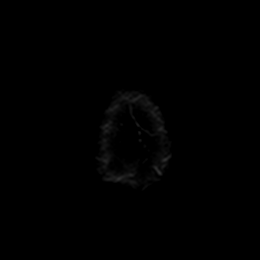

[Series 6: DWI · axial · 4.0mm · 0.88mm/px · z∈[-73,+58]mm · 4 of 36 slices shown (3 of 6)]
[im 1/36]
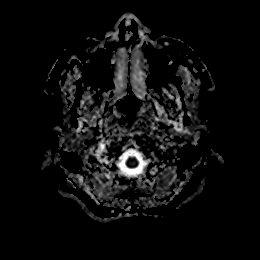
[im 12/36]
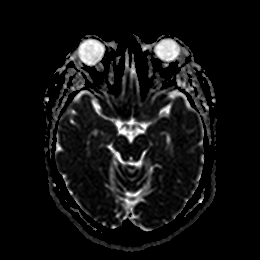
[im 24/36]
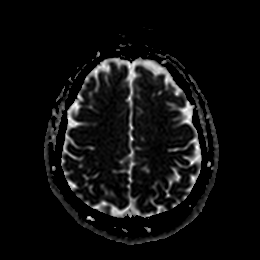
[im 36/36]
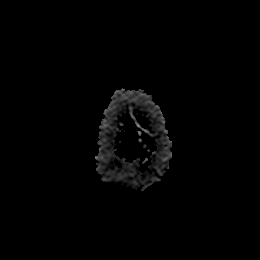

[Series 7: DWI · coronal · 5.0mm · 0.88mm/px · 4 of 28 slices shown (4 of 6)]
[im 1/28]
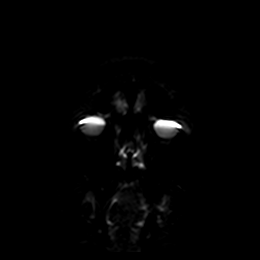
[im 10/28]
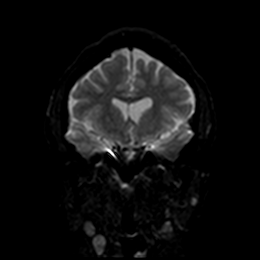
[im 19/28]
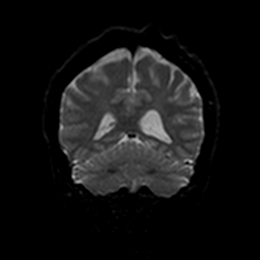
[im 28/28]
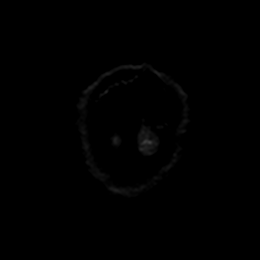

[Series 7: DWI · coronal · 5.0mm · 0.88mm/px · 4 of 28 slices shown (5 of 6)]
[im 1/28]
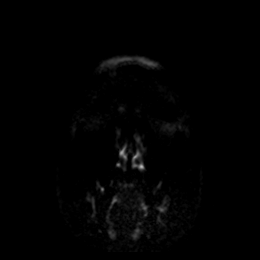
[im 10/28]
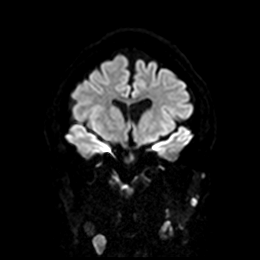
[im 19/28]
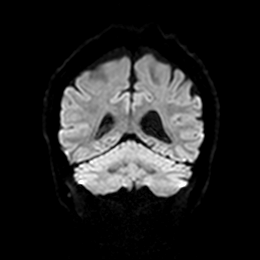
[im 28/28]
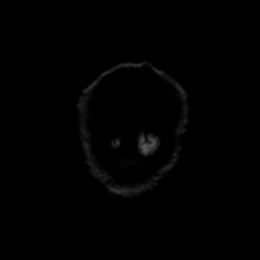

[Series 8: DWI · coronal · 5.0mm · 0.88mm/px · 4 of 28 slices shown (6 of 6)]
[im 1/28]
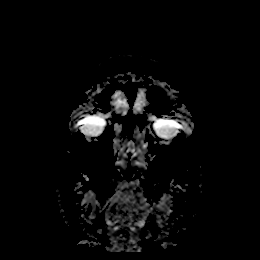
[im 10/28]
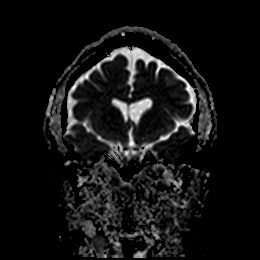
[im 19/28]
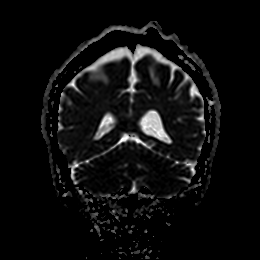
[im 28/28]
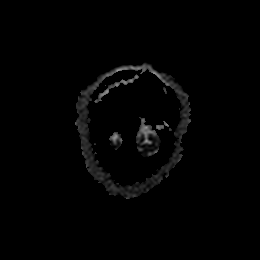

[Series 9: T1 · sagittal · 5.0mm · 0.94mm/px · 3 of 22 slices shown]
[im 1/22]
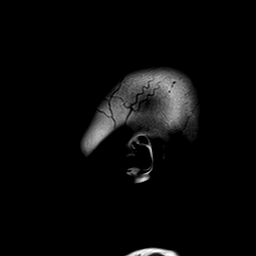
[im 11/22]
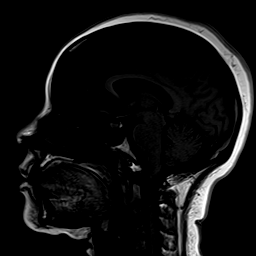
[im 22/22]
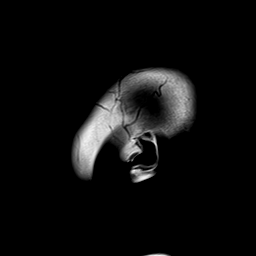

[Series 10: T2 · axial · 5.0mm · 0.72mm/px · z∈[-70,+55]mm · 3 of 20 slices shown (1 of 2)]
[im 1/20]
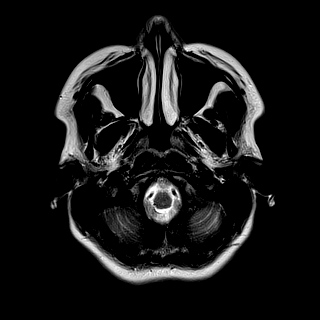
[im 10/20]
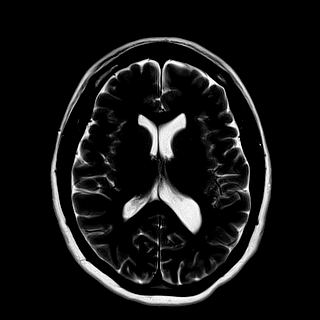
[im 20/20]
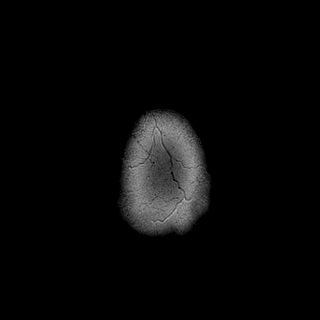

[Series 11: ax hemo · axial · 5.0mm · 0.86mm/px · z∈[-74,+61]mm · 3 of 25 slices shown]
[im 1/25]
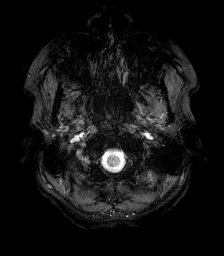
[im 13/25]
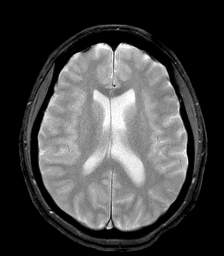
[im 25/25]
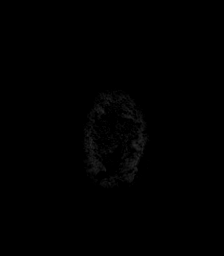

[Series 12: FLAIR · axial · 4.0mm · 0.43mm/px · z∈[-72,+60]mm · 5 of 36 slices shown]
[im 1/36]
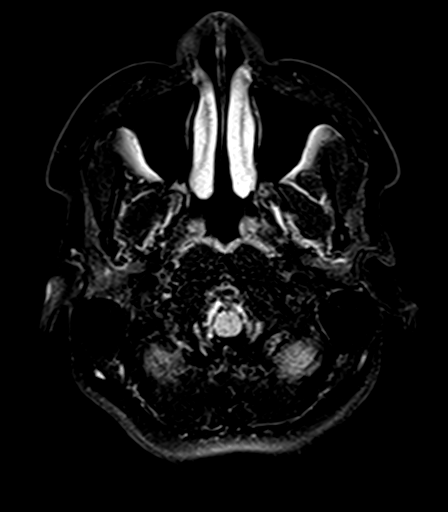
[im 9/36]
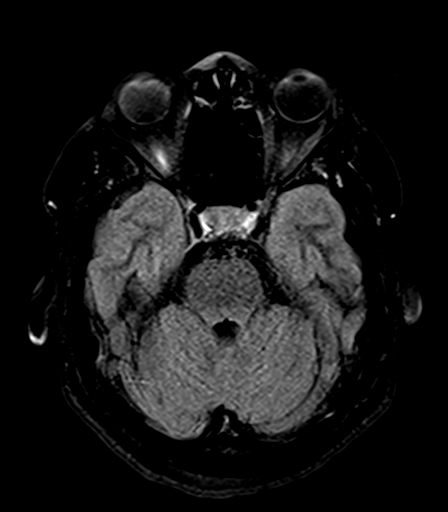
[im 18/36]
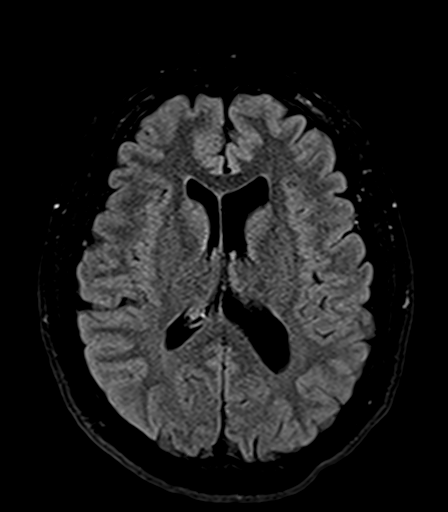
[im 27/36]
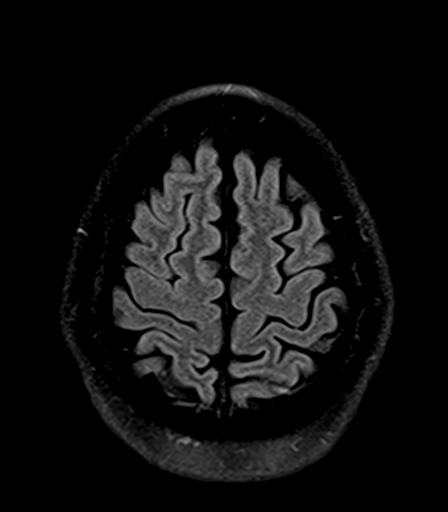
[im 36/36]
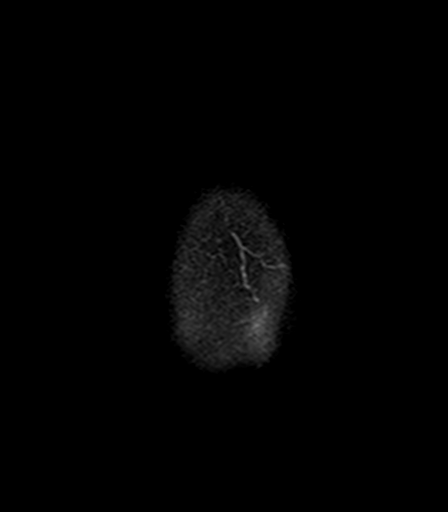

[Series 14: T2 · coronal · 5.0mm · 0.72mm/px · 4 of 27 slices shown (2 of 2)]
[im 1/27]
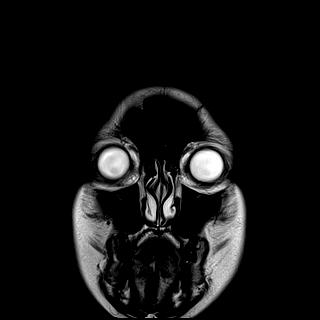
[im 9/27]
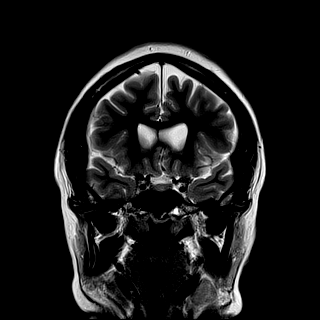
[im 18/27]
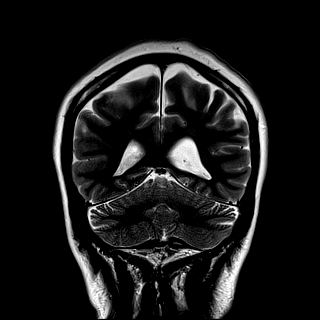
[im 27/27]
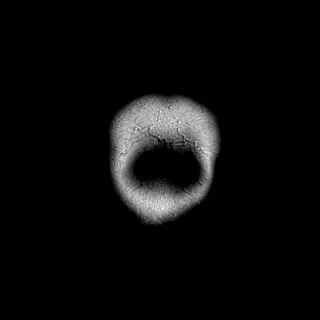

[42 of 48 positions shown; findings below may reference images not displayed]

FINDINGS: Brain: No restricted diffusion to suggest acute or subacute infarct.
No acute hemorrhage, mass, mass effect, or midline shift. No
hydrocephalus or extra-axial collection. No abnormal T2 hyperintense
signal in the periventricular or juxtacortical white matter. No foci
of hemosiderin deposition to suggest remote hemorrhage. Normal
pituitary.

Vascular: Normal flow voids.

Skull and upper cervical spine: Normal marrow signal.

Sinuses/Orbits: Negative.

Other: The mastoids are well aerated.
IMPRESSION: No acute intracranial process.

## 2021-09-11 IMAGING — CT CT ANGIO HEAD-NECK (W OR W/O PERF)
2 of 7 series · 8 of 33 positions shown · IV contrast (Omnipaque or Isovue)
Comparison: Head CT same day

CLINICAL DATA: Neuro deficit, acute, stroke suspected. Left-sided
weakness. History of vertebral dissection. Ehlers-Danlos syndrome.

EXAM:
CT ANGIOGRAPHY HEAD AND NECK
TECHNIQUE: Multidetector CT imaging of the head and neck was performed using
the standard protocol during bolus administration of intravenous
contrast. Multiplanar CT image reconstructions and MIPs were
obtained to evaluate the vascular anatomy. Carotid stenosis
measurements (when applicable) are obtained utilizing NASCET
criteria, using the distal internal carotid diameter as the
denominator.

[Series 4: cta head & neck · axial · 0.49mm/px · z∈[+1297,+1411]mm · 2 of 172 slices shown]
[im 58/172  soft-tissue]
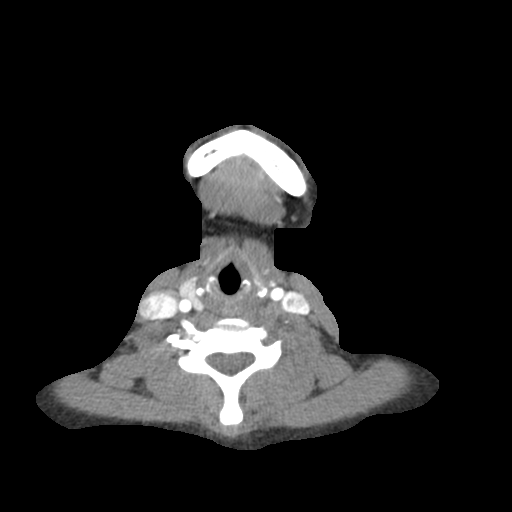
[im 115/172  soft-tissue]
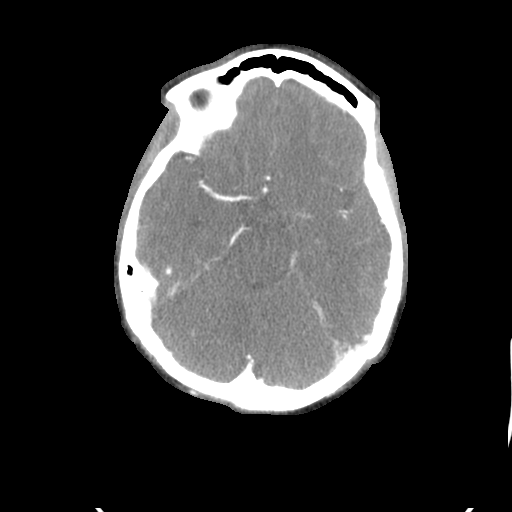

[Series 6: ax thins · axial · 0.44mm/px · z∈[+1232,+1479]mm · 6 of 347 slices shown]
[im 50/347  soft-tissue]
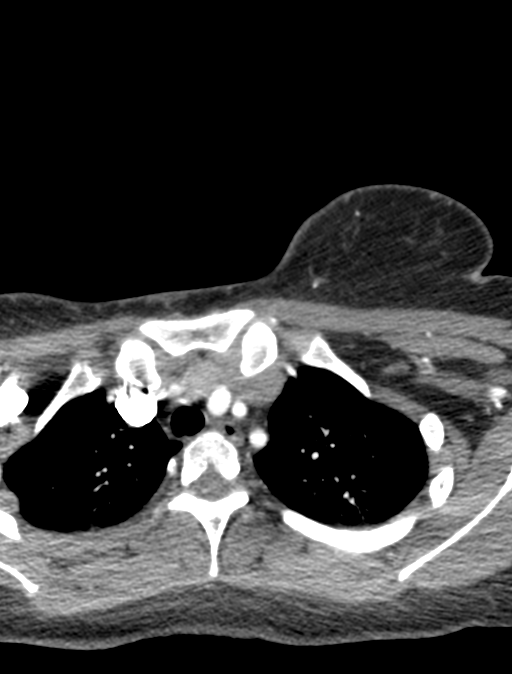
[im 99/347  bone]
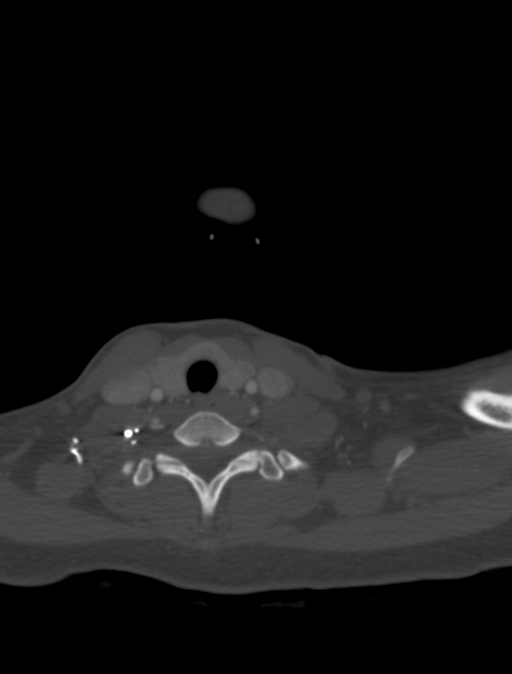
[im 149/347  soft-tissue]
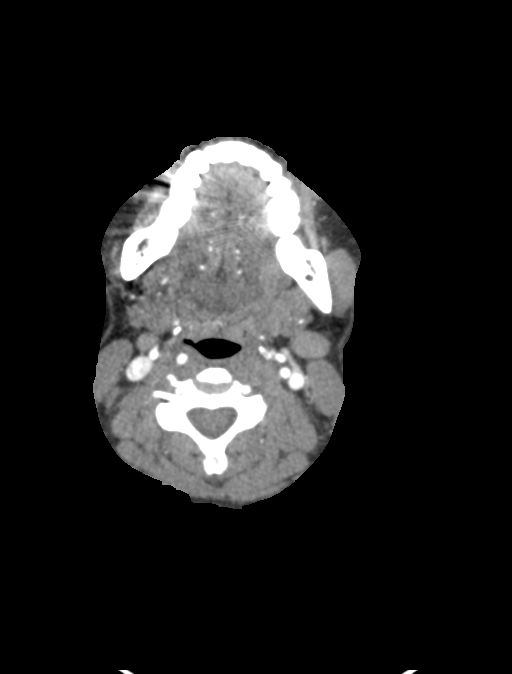
[im 198/347  bone]
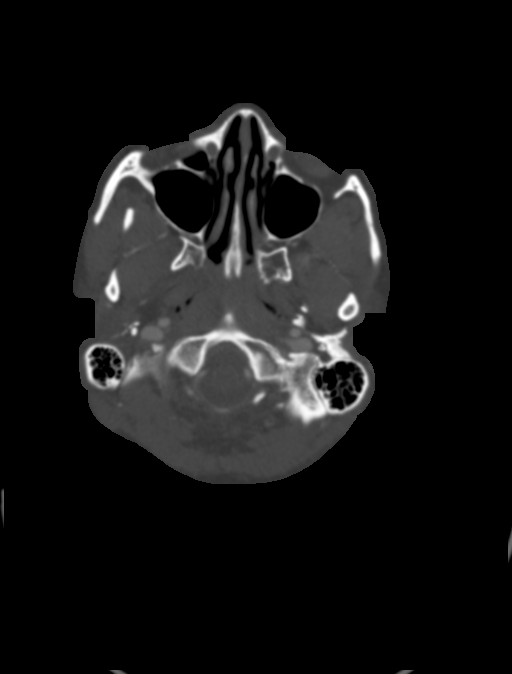
[im 248/347  soft-tissue]
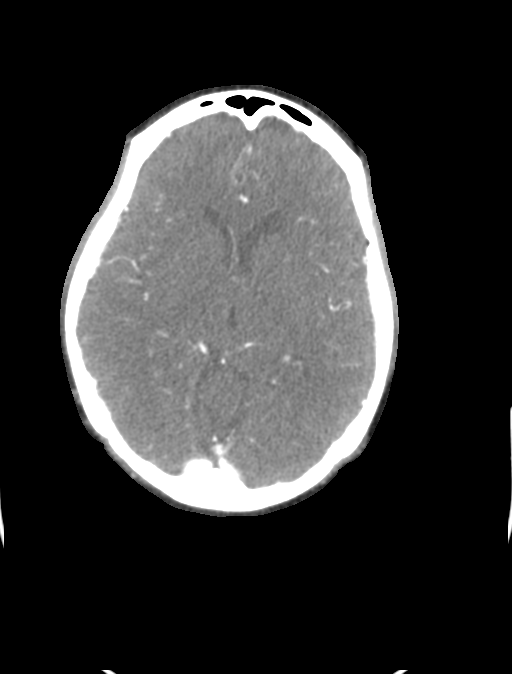
[im 297/347  bone]
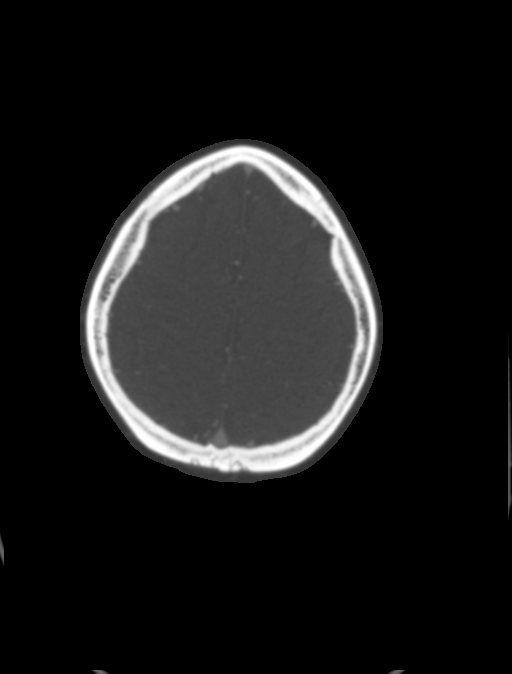

[8 of 33 positions shown; findings below may reference images not displayed]

RADIATION DOSE REDUCTION: This exam was performed according to the
departmental dose-optimization program which includes automated
exposure control, adjustment of the mA and/or kV according to
patient size and/or use of iterative reconstruction technique.

CONTRAST:  75mL OMNIPAQUE IOHEXOL 350 MG/ML SOLN
FINDINGS: CTA NECK FINDINGS

Aortic arch: Normal

Right carotid system: Normal. No carotid dissection. No evidence of
stenosis.

Left carotid system: Normal.  No carotid dissection.  No stenosis.

Vertebral arteries: Both vertebral artery origins are widely patent.
Both vertebral arteries appear normal through the cervical region.
No sign of prior dissection. No stenosis or pseudo aneurysm. Both
vertebral arteries reach the basilar. No basilar stenosis.

Skeleton: Normal

Other neck: No mass or lymphadenopathy. Numerous small thyroid
nodules or cysts, the largest measuring 7 mm.

Upper chest: Normal

Review of the MIP images confirms the above findings

CTA HEAD FINDINGS

Anterior circulation: Both internal carotid arteries are patent
through the skull base and siphon regions. The anterior and middle
cerebral vessels are normal without vessel occlusion, proximal
stenosis, aneurysm or vascular malformation.

Posterior circulation: Both vertebral arteries patent to the
basilar. No basilar stenosis. Posterior circulation branch vessels
are normal.

Venous sinuses: Patent and normal.

Anatomic variants: None significant.

Review of the MIP images confirms the above findings
IMPRESSION: Normal examination. No evidence of vessel occlusion or proximal
stenosis. No dissection presently. No sign of prior dissection.

Numerous small cysts or nodules of the thyroid, the largest 7 mm. No
followup recommended (ref: [HOSPITAL]. [DATE]):

## 2021-09-11 IMAGING — CT CT HEAD CODE STROKE
3 of 4 series · 15 of 47 positions shown, 18 images · non-contrast
Comparison: None.

CLINICAL DATA: Code stroke. Neuro deficit, acute, stroke suspected.
Left-sided weakness.



[Series 2: head w o · axial · 0.43mm/px · z∈[+1355,+1500]mm · 9 of 35 slices shown, 12 images]
[im 3/35  brain]
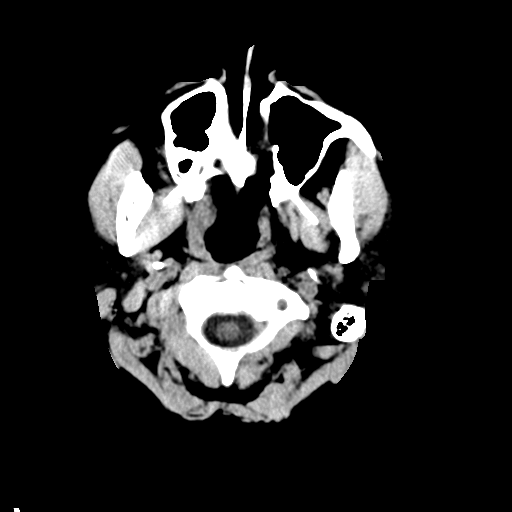
[im 3/35  bone]
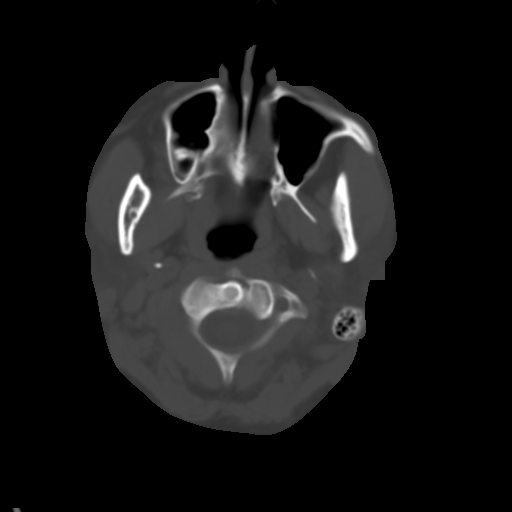
[im 8/35  brain]
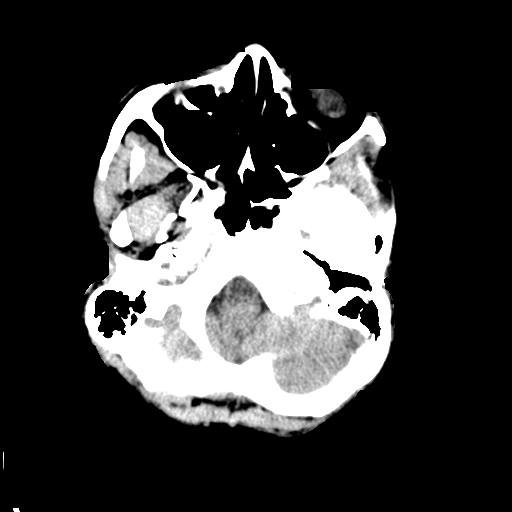
[im 10/35  brain]
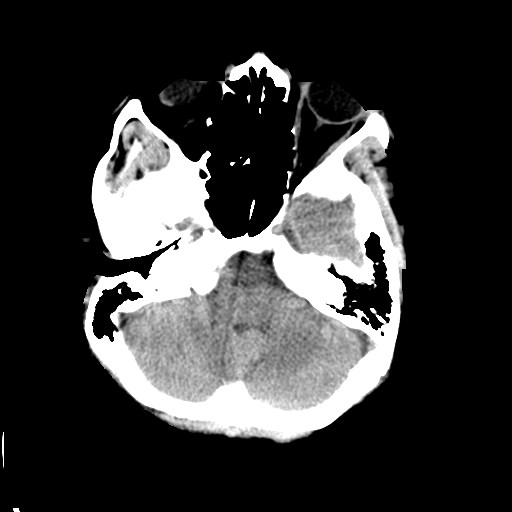
[im 15/35  brain]
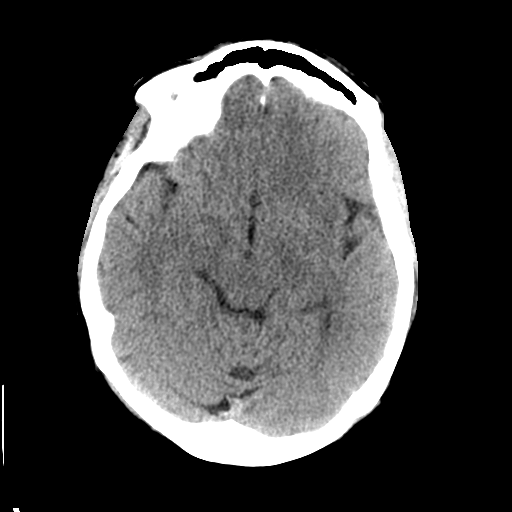
[im 18/35  brain]
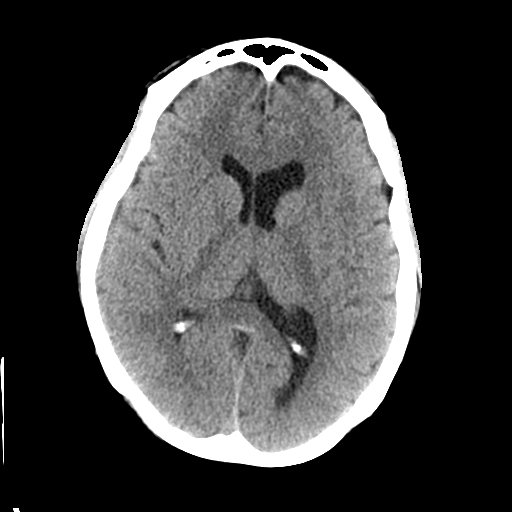
[im 18/35  bone]
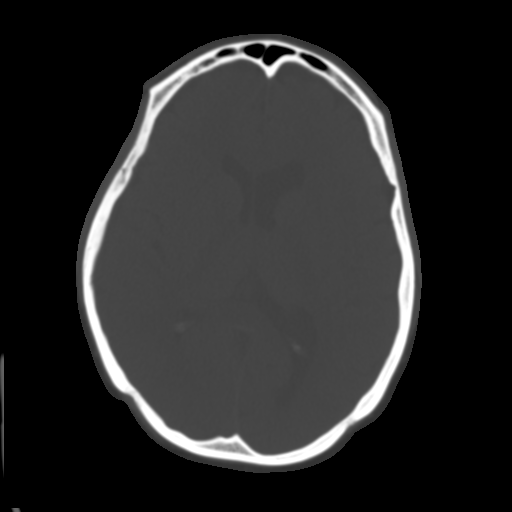
[im 20/35  brain]
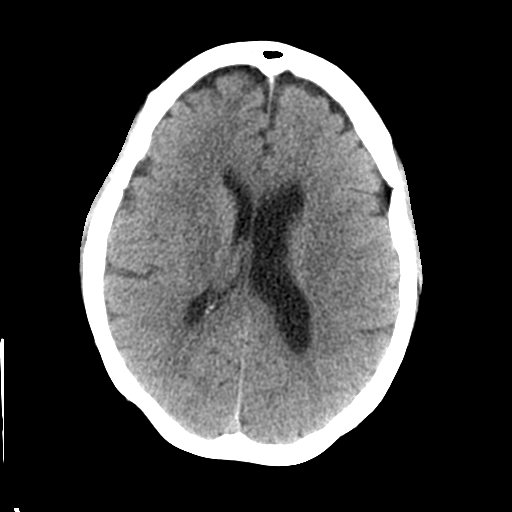
[im 25/35  brain]
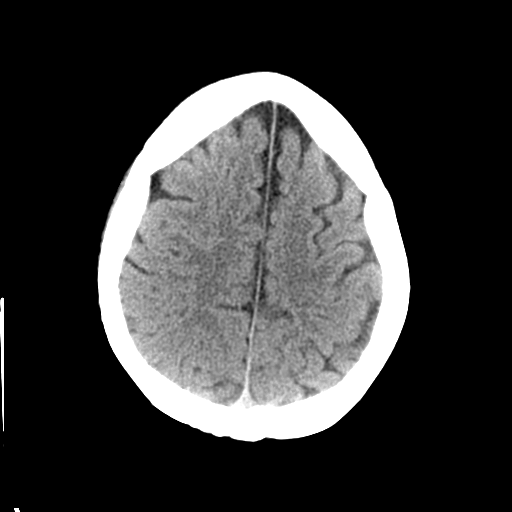
[im 27/35  brain]
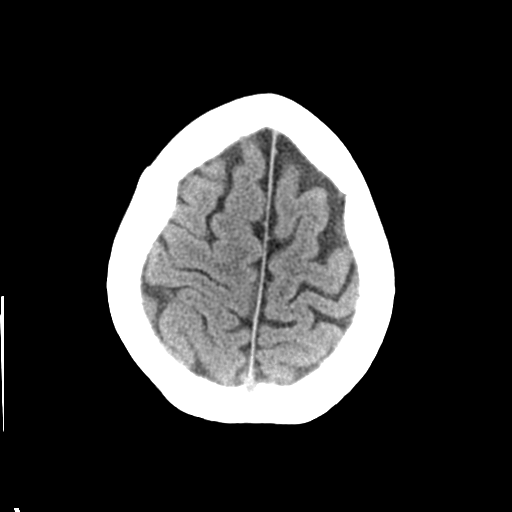
[im 32/35  brain]
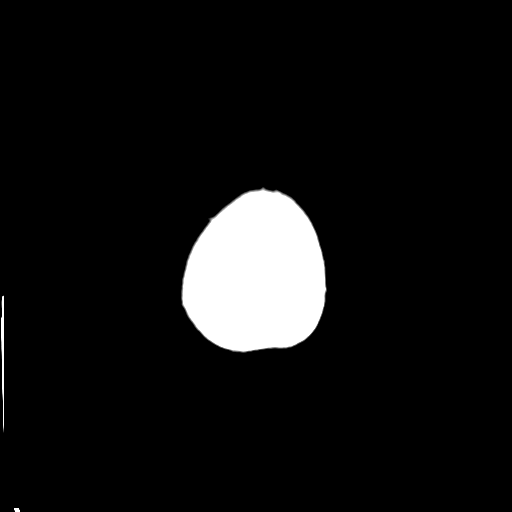
[im 32/35  bone]
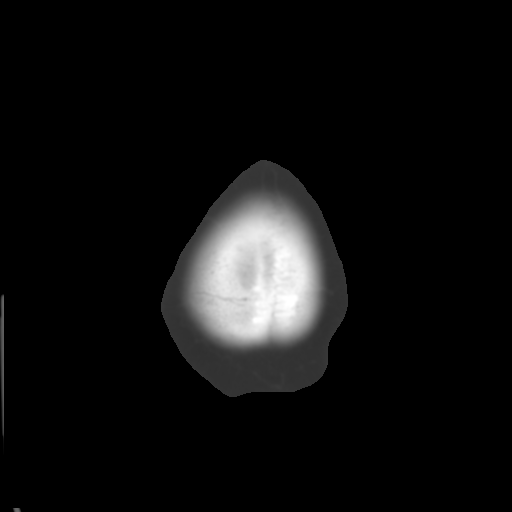

[Series 4: coronal soft · coronal · 0.35mm/px · 3 of 84 slices shown]
[im 28/84  brain]
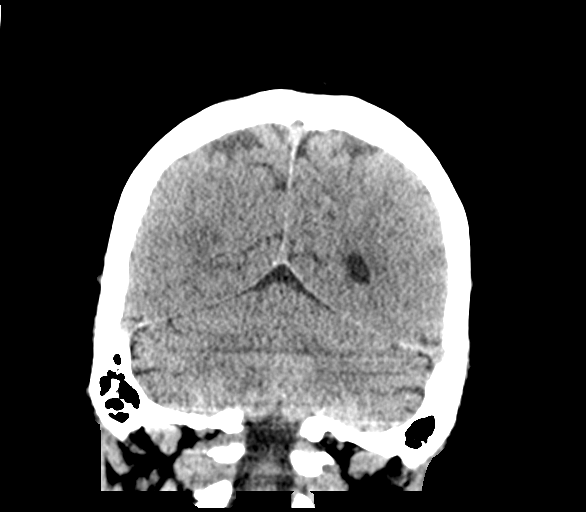
[im 37/84  brain]
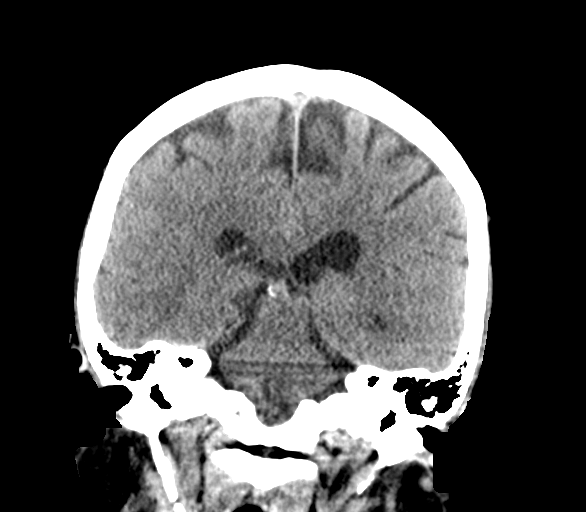
[im 47/84  brain]
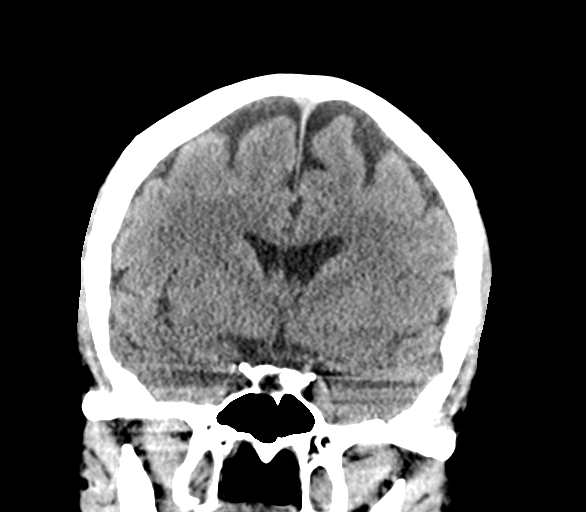

[Series 5: sagittal soft · sagittal · 0.37mm/px · 3 of 67 slices shown]
[im 23/67  brain]
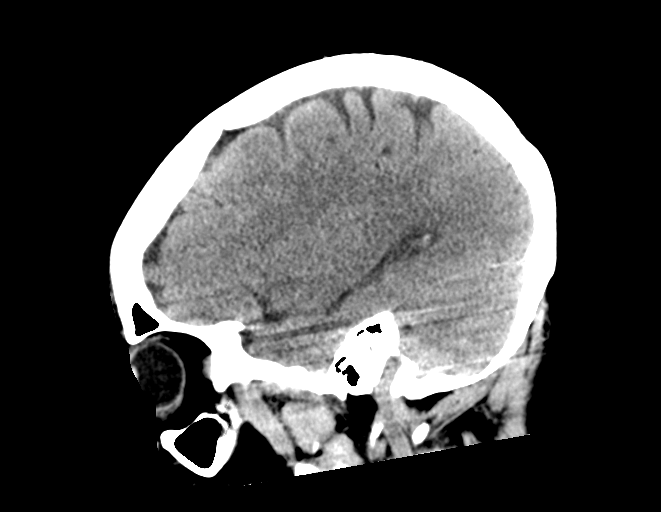
[im 34/67  brain]
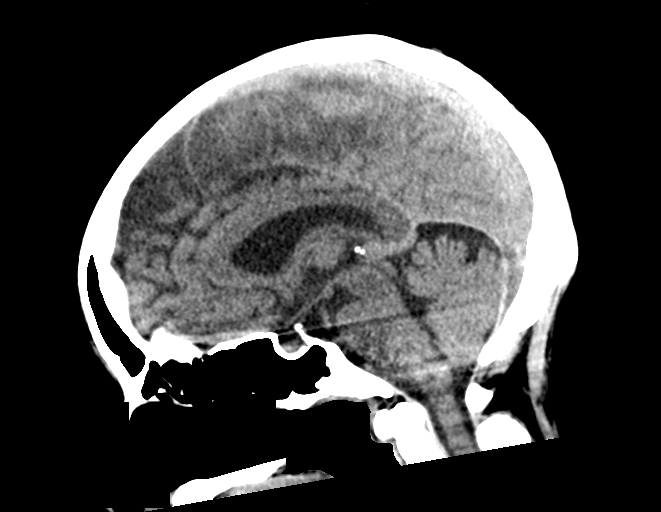
[im 45/67  brain]
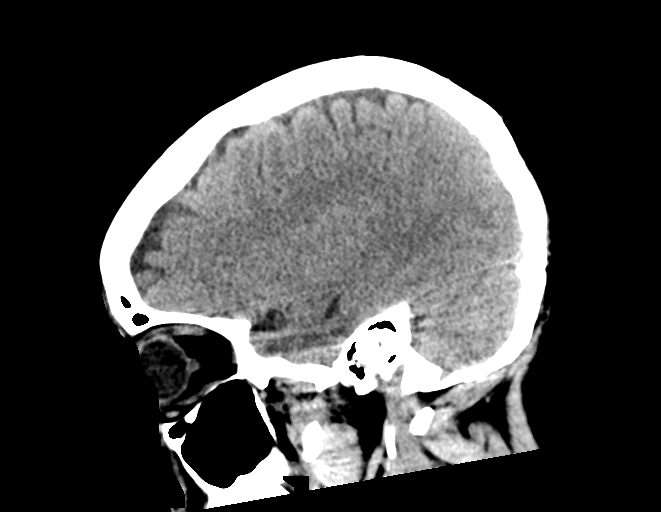

[15 of 47 positions shown; findings below may reference images not displayed]

FINDINGS: Brain: Normal appearance without evidence of old or acute
infarction, mass lesion, hemorrhage, hydrocephalus or extra-axial
collection.

Vascular: No calcified plaque or hyperdense vessel.

Skull: Normal

Sinuses/Orbits: Normal

Other: None

ASPECTS (Alberta Stroke Program Early CT Score)

- Ganglionic level infarction (caudate, lentiform nuclei, internal
capsule, insula, M1-M3 cortex): 7

- Supraganglionic infarction (M4-M6 cortex): 3

Total score (0-10 with 10 being normal): 10
IMPRESSION: 1. Normal head CT.
2. ASPECTS is 10.
3. These results were called by telephone at the time of
interpretation on [DATE] at [DATE] to provider ROMEUS ,
who verbally acknowledged these results.

## 2021-09-11 MED ORDER — ACETAMINOPHEN 650 MG RE SUPP: 650.0000 mg | RECTAL | Status: DC | PRN

## 2021-09-11 MED ORDER — SODIUM CHLORIDE 0.9 % IV SOLN: INTRAVENOUS | Status: DC

## 2021-09-11 MED ORDER — ACETAMINOPHEN 160 MG/5ML PO SOLN: 650.0000 mg | ORAL | Status: DC | PRN

## 2021-09-11 MED ORDER — ACETAMINOPHEN 325 MG PO TABS: 650.0000 mg | ORAL_TABLET | ORAL | Status: DC | PRN

## 2021-09-11 MED ORDER — KETOROLAC TROMETHAMINE 30 MG/ML IJ SOLN
30.0000 mg | Freq: Once | INTRAMUSCULAR | Status: AC
  Administered 2021-09-11: 30 mg via INTRAVENOUS
  Filled 2021-09-11: qty 1

## 2021-09-11 MED ORDER — IOHEXOL 350 MG/ML SOLN
75.0000 mL | Freq: Once | INTRAVENOUS | Status: AC | PRN
  Administered 2021-09-11: 75 mL via INTRAVENOUS

## 2021-09-11 MED ORDER — ENOXAPARIN SODIUM 40 MG/0.4ML IJ SOSY: 40.0000 mg | PREFILLED_SYRINGE | INTRAMUSCULAR | Status: DC

## 2021-09-11 MED ORDER — CLOPIDOGREL BISULFATE 75 MG PO TABS
75.0000 mg | ORAL_TABLET | Freq: Every day | ORAL | Status: DC
Start: 2021-09-11 — End: 2021-09-12
  Administered 2021-09-11 – 2021-09-12 (×2): 75 mg via ORAL
  Filled 2021-09-11 (×2): qty 1

## 2021-09-11 MED ORDER — ASPIRIN EC 81 MG PO TBEC
81.0000 mg | DELAYED_RELEASE_TABLET | Freq: Every day | ORAL | Status: DC
  Administered 2021-09-11 – 2021-09-12 (×2): 81 mg via ORAL
  Filled 2021-09-11 (×2): qty 1

## 2021-09-11 MED ORDER — TOPIRAMATE 25 MG PO TABS
50.0000 mg | ORAL_TABLET | Freq: Every evening | ORAL | Status: DC
  Administered 2021-09-11: 50 mg via ORAL
  Filled 2021-09-11: qty 2

## 2021-09-11 MED ORDER — PROCHLORPERAZINE EDISYLATE 10 MG/2ML IJ SOLN
10.0000 mg | Freq: Once | INTRAMUSCULAR | Status: AC
  Administered 2021-09-11: 10 mg via INTRAVENOUS
  Filled 2021-09-11: qty 2

## 2021-09-11 MED ORDER — DIPHENHYDRAMINE HCL 50 MG/ML IJ SOLN
25.0000 mg | Freq: Once | INTRAMUSCULAR | Status: AC
  Administered 2021-09-11: 25 mg via INTRAVENOUS
  Filled 2021-09-11: qty 1

## 2021-09-11 MED ORDER — STROKE: EARLY STAGES OF RECOVERY BOOK: Freq: Once | Status: AC

## 2021-09-11 NOTE — Consult Note (Signed)
NEUROLOGY TELECONSULTATION NOTE  ? ?Date of service: September 11, 2021 ?Patient Name: Shannon Fleming ?MRN:  [id number] ?DOB:  1985-09-19 ?Reason for consult: telestroke for headache and L sided weakness ? ?Requesting Provider: Dr. Noemi Chapel ?Consult Participants: myself, patient, bedside RN, telestroke RN ?Location of the provider: [place], [place] ?Location of the patient: [place] ? ?This consult was provided via telemedicine with 2-way video and audio communication. The patient/family was informed that care would be provided in this way and agreed to receive care in this manner.  ? ?_ _ _   _ __   _ __ _ _  __ __   _ __   __ _ ? ?History of Present Illness  ? 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 She currently has a headache and blurry vision in her R eye, which is typical per chart review for her migraines. She has had unilateral weakness with this in the past, after which MRI did not show acute infarct. She had closure of PFO in 2021 and since then has not had any episodes of weakness until today. NIHSS = 2 for subtle drift in L arm and L leg, improving. CT head NAICP, personal review. 2/2 mild and improving sx c/w prior atypical migraines TNK was not administered. CTA WNL with no e/o LVO or dissection. ?  ?ROS  ? ?Per HPI; all other systems reviewed and are negative ? ?Past History  ? ?The following was personally reviewed: ? ?Past Medical History:  ?Diagnosis Date  ? Medical history non-contributory   ? Stroke [facility])   ? ?Past Surgical History:  ?Procedure Laterality Date  ? NO PAST SURGERIES    ? ?No family history on file. ?Social History  ? ?Socioeconomic History  ? Marital status: Single  ?  Spouse name: Not on file  ? Number of children: Not on file  ? Years of  education: Not on file  ? Highest education level: Not on file  ?Occupational History  ? Not on file  ?Tobacco Use  ? Smoking status: Never  ? Smokeless tobacco: Never  ?Substance and Sexual Activity  ? Alcohol use: No  ? Drug use: No  ? Sexual activity: Yes  ?  Birth control/protection: None  ?Other Topics Concern  ? Not on file  ?Social History Narrative  ? Not on file  ? ?Social Determinants of Health  ? ?Financial Resource Strain: Not on file  ?Food Insecurity: Not on file  ?Transportation Needs: Not on file  ?Physical Activity: Not on file  ?Stress: Not on file  ?Social Connections: Not on file  ? ?No Known Allergies ? ?Medications  ? ?(Not in a hospital admission) ?  ? ?No current facility-administered medications for this encounter. ? ?Current Outpatient Medications:  ?  cephALEXin (KEFLEX) 500 MG capsule, Take 1 capsule (500 mg total) by mouth 4 (four) times daily., Disp: 28 capsule, Rfl: 2 ?  metoCLOPramide (REGLAN) 10 MG tablet, Take 10 mg by mouth every 6 (six) hours as needed for nausea or vomiting., Disp: , Rfl:  ?  ondansetron (ZOFRAN) 4 MG tablet, Take 4 mg by mouth every 8 (eight) hours as needed for nausea or vomiting., Disp: , Rfl:  ?  promethazine (PHENERGAN) 25 MG suppository, Place 1 suppository (25 mg total) rectally every  6 (six) hours as needed for nausea or vomiting., Disp: 12 each, Rfl: 2 ?  promethazine (PHENERGAN) 25 MG tablet, Take 25 mg by mouth every 6 (six) hours as needed for nausea or vomiting., Disp: , Rfl:  ? ?Vitals  ? ?Vitals:  ? 09/11/21 1712  ?BP: 127/88  ?Pulse: 96  ?Resp: 20  ?Temp: 98.1 ?F (36.7 ?C)  ?TempSrc: Oral  ?SpO2: 100%  ?Weight: 73.5 kg  ?Height: 5\' 3"  (1.6 m)  ?  ? ?Body mass index is 28.7 kg/m?. ? ?Physical Exam  ? ?Exam performed over telemedicine with 2-way video and audio communication and with assistance of bedside RN ? ?Physical Exam ?Gen: A&O x4, NAD ?Resp: normal WOB ?CV: extremities appear well-perfused ? ?Neuro: ?*MS: A&O x4. Follows multi-step commands.   ?*Speech: nondysarthric, no aphasia, able to name and repeat ?*CN: PERRL 15mm, EOMI, VFF by confrontation, sensation intact, smile symmetric, hearing intact to voice ?*Motor:   Normal bulk.  No tremor, rigidity or bradykinesia. No pronator drift. Subtle drift LUE and LLE. RUE and RLE full strength. ?*Sensory: SILT. Symmetric. No double-simultaneous extinction.  ?*Coordination:  Finger-to-nose, heel-to-shin, rapid alternating motions were intact. ?*Reflexes:  UTA 2/2 tele-exam ?*Gait: deferred ? ?NIHSS = 2 for LUE and LLE drift ? ? ?Premorbid mRS = 0 ? ? ?Labs  ? ?CBC: No results for input(s): WBC, NEUTROABS, HGB, HCT, MCV, PLT in the last 168 hours. ? ?Basic Metabolic Panel:  ?Lab Results  ?Component Value Date  ? NA 135 06/25/2014  ? K 4.0 06/25/2014  ? CO2 22 06/25/2014  ? GLUCOSE 76 06/25/2014  ? BUN 9 06/25/2014  ? CREATININE 0.62 06/25/2014  ? CALCIUM 9.8 06/25/2014  ? GFRNONAA >90 06/25/2014  ? GFRAA >90 06/25/2014  ? ?Lipid Panel: No results found for: [place] ?HgbA1c: No results found for: HGBA1C ?Urine Drug Screen: No results found for: LABOPIA, COCAINSCRNUR, [place], [place], THCU, LABBARB  ?Alcohol Level No results found for: ETH ? ? ?Impression  ? 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 She currently has a headache and blurry vision in her R eye, which is typical per chart review for her migraines. She has had unilateral weakness with this in the past per patient, after which MRI did not show acute infarct.  ? ?NIHSS = 2 for subtle drift in L arm and L leg, improving. CT head NAICP, personal review. 2/2 mild and improving sx c/w prior atypical migraines TNK was not administered. CTA WNL with no e/o LVO or dissection. ? ?Favor sx to be 2/2 atypical migraine, but recommend  obs admission with MRI and stroke/TIA workup to rule out small ischemic event. ? ?Recommendations  ? ?- Admit to hospitalist service for stroke w/u ?- Permissive HTN x48 hrs from sx onset or until stroke ruled out by MRI goal BP <220/110. PRN labetalol or hydralazine if BP above these parameters. Avoid oral antihypertensives. ?- MRI brain wo contrast ?- TTE  ?- Check A1c and LDL + add statin per guidelines ?- ASA 81mg  daily + plavix 75mg  daily x21 days f/b ASA 81mg  daily monotherapy after that ?- q4 hr neuro checks ?- STAT head CT for any change in neuro exam ?- Tele ?- PT/OT/SLP ?- Stroke education ?- F/u with established outpatient neurologist at discharge ? ? ?______________________________________________________________________ ? ? ?Thank  you for the opportunity to take part in the care of this patient. If you have any further questions, please contact the neurology consultation attending. ? ?Signed, ? ?Su Monks, MD ?[facility] ?[phone] ? ?If 7pm- 7am, please page neurology on call as listed in [place]. ? ?

## 2021-09-11 NOTE — Progress Notes (Signed)
Code stroke activated at 1727. Patient already in CT on cart activation. Dr. Selina Cooley connected to telestoke cart for code stroke evaluation at 1738. NCCT completed. Patient did not have adequate IV access for CTA to be done. Neuro exam completed and patient to have access placed for further imaging. ?

## 2021-09-11 NOTE — ED Provider Notes (Signed)
?[facility] ?Provider Note ? ? ?CSN: [id number] ?Arrival date & time: 09/11/21  1707 ? ?  ? ?History ? ?No chief complaint on file. ? ? ?Shannon Fleming is a 36 y.o. female. ? ?HPI ? ?This patient is a 36 year old female, she has a prior history of a vertebral artery dissection diagnosed in 2019, this was in April 2019 when she was diagnosed with an acute stroke secondary to a right-sided vertebral artery dissection with subsequent ischemia.  She was placed on aspirin Plavix and Lipitor.  She improved significantly and was discharged home with physical therapy and Occupational Therapy.  She follows with the neurology clinic in [place].  She was then admitted back to the hospital in July 2019 during which time she had similar left-sided weakness, she was ruled out for an acute stroke, it was noted that she had a patent foramen ovale which was ultimately surgically closed in 2021. ? ?It was noted in notes from outside hospital on my review of the medical record at [facility] system that she has a history of Ehlers-Danlos syndrome, that was what led to the spontaneous vertebral artery dissection, she had presented with left arm numbness tingling and weakness, evidently the patient was given tPA for strokelike symptoms and transferred to the hospital for post tPA monitoring.  During that admission in 2020 she had an MRI of the brain that showed no signs of stroke, she was asked to take Plavix for an additional 90 days. ? ?She states that ever since the surgical procedure to repair her patent foramen ovale she has not had any recurrent symptoms however today she presents stating that at 445 exactly 35 minutes prior to arrival the patient developed acute onset of left arm weakness, she was trying to get into the car to come to the hospital when she fell to the ground stating that she felt like she lost her balance and noticed that her left leg was also weak.  She has not had any  difficulty speaking and denies any visual changes.  Her symptoms are consistent, they have not gotten any better, she has not abnormal feeling in her head and feels like her neck feels abnormal as well with the pain on the right side of her neck. ? ?I have reviewed the patient's medications, she is not currently taking aspirin or Plavix as far as I can tell ? ? ? ?Home Medications ?Prior to Admission medications   ?Medication Sig Start Date End Date Taking? Authorizing Provider  ?cephALEXin (KEFLEX) 500 MG capsule Take 1 capsule (500 mg total) by mouth 4 (four) times daily. 06/28/14   Seabron Spates, CNM  ?metoCLOPramide (REGLAN) 10 MG tablet Take 10 mg by mouth every 6 (six) hours as needed for nausea or vomiting.    [provider]  ?ondansetron (ZOFRAN) 4 MG tablet Take 4 mg by mouth every 8 (eight) hours as needed for nausea or vomiting.    [provider]  ?promethazine (PHENERGAN) 25 MG suppository Place 1 suppository (25 mg total) rectally every 6 (six) hours as needed for nausea or vomiting. 06/25/14   Barefoot, Connye Burkitt, NP  ?promethazine (PHENERGAN) 25 MG tablet Take 25 mg by mouth every 6 (six) hours as needed for nausea or vomiting.    [provider]  ?   ? ?Allergies    ?Patient has no known allergies.   ? ?Review of Systems   ?Review of Systems  ?All other systems reviewed and are negative. ? ?  Physical Exam ?Updated Vital Signs ?BP 127/88 (BP Location: Right Arm)   Pulse 96   Temp 98.1 ?F (36.7 ?C) (Oral)   Resp 20   Ht 1.6 m (5\' 3" )   Wt 73.5 kg   SpO2 100%   BMI 28.70 kg/m?  ?Physical Exam ?Vitals and nursing note reviewed.  ?Constitutional:   ?   General: She is not in acute distress. ?   Appearance: She is well-developed.  ?HENT:  ?   Head: Normocephalic and atraumatic.  ?   Mouth/Throat:  ?   Pharynx: No oropharyngeal exudate.  ?Eyes:  ?   General: No scleral icterus.    ?   Right eye: No discharge.     ?   Left eye: No discharge.  ?   Conjunctiva/sclera:  Conjunctivae normal.  ?   Pupils: Pupils are equal, round, and reactive to light.  ?Neck:  ?   Thyroid: No thyromegaly.  ?   Vascular: No JVD.  ?   Comments: No tenderness or bruit over the neck ?Cardiovascular:  ?   Rate and Rhythm: Normal rate and regular rhythm.  ?   Heart sounds: Normal heart sounds. No murmur heard. ?  No friction rub. No gallop.  ?Pulmonary:  ?   Effort: Pulmonary effort is normal. No respiratory distress.  ?   Breath sounds: Normal breath sounds. No wheezing or rales.  ?Abdominal:  ?   General: Bowel sounds are normal. There is no distension.  ?   Palpations: Abdomen is soft. There is no mass.  ?   Tenderness: There is no abdominal tenderness.  ?Musculoskeletal:     ?   General: No tenderness. Normal range of motion.  ?   Cervical back: Normal range of motion and neck supple.  ?Lymphadenopathy:  ?   Cervical: No cervical adenopathy.  ?Skin: ?   General: Skin is warm and dry.  ?   Findings: No erythema or rash.  ?Neurological:  ?   Mental Status: She is alert.  ?   Coordination: Coordination normal.  ?   Comments: Slightly weaker left arm grip, slight drift of the left arm, slight weakness of the left leg, no facial droop, clear speech, visual acuity is normal and peripheral visual fields appear normal  ?Psychiatric:     ?   Behavior: Behavior normal.  ? ? ?ED Results / Procedures / Treatments   ?Labs ?(all labs ordered are listed, but only abnormal results are displayed) ?Labs Reviewed  ?RESP PANEL BY RT-PCR (FLU A&B, COVID) ARPGX2  ?ETHANOL  ?PROTIME-INR  ?APTT  ?CBC  ?DIFFERENTIAL  ?COMPREHENSIVE METABOLIC PANEL  ?RAPID URINE DRUG SCREEN, HOSP PERFORMED  ?URINALYSIS, ROUTINE W REFLEX MICROSCOPIC  ?I-STAT CHEM 8, ED  ?POC URINE PREG, ED  ? ? ?EKG ?None ? ?Radiology ?No results found. ? ?Procedures ?Procedures  ? ? ?Medications Ordered in ED ?Medications - No data to display ? ?ED Course/ Medical Decision Making/ A&P ?  ?                        ?Medical Decision Making ?Amount and/or  Complexity of Data Reviewed ?Labs: ordered. ?Radiology: ordered. ? ?Risk ?Prescription drug management. ? ? ?This patient presents to the ED for concern of unilateral weakness, this involves an extensive number of treatment options, and is a complaint that carries with it a high risk of complications and morbidity.  The differential diagnosis includes acute stroke, migraine, seizure, nonorganic causes considered as  well ? ? ?Co morbidities that complicate the patient evaluation ? ?Prior vertebral artery dissection ? ? ?Additional history obtained: ? ?Additional history obtained from electronic medical record ?External records from outside source obtained and reviewed including prior admissions and clinic records, extensively covered in history ? ?A code stroke was activated immediately upon patient arrival. ? ? ?Lab Tests: ? ?I Ordered, and personally interpreted labs.  The pertinent results include: Metabolic panel which was normal, glucose which was normal, INR and PT which were normal, CBC which was unremarkable ? ? ?Imaging Studies ordered: ? ?I ordered imaging studies including CT scan of the brain as well as CT angiograms of the head and the neck which showed no signs of acute dissection blockages or ischemia.  There is no sign of prior dissection otherwise. ?I independently visualized and interpreted imaging which showed the above ?I agree with the radiologist interpretation ? ? ?Cardiac Monitoring: ? ?The patient was maintained on a cardiac monitor.  I personally viewed and interpreted the cardiac monitored which showed an underlying rhythm of: Normal sinus rhythm ? ? ?Medicines ordered and prescription drug management: ? ?I considered the use of tPA but after discussion with neuro hospitalist this was recommended against. ?I have reviewed the patients home medicines and have made adjustments as needed ?Migraine medications given ? ? ?Test Considered: ? ?MRI of the brain, this will need to be done as an  inpatient as it is not available at this hour ? ? ?Critical Interventions: ? ?Acute code stroke activated, discussed with neuro hospitalist, they saw the patient rapidly, CT scans were done rapidly, labs were done rapidly, the pa

## 2021-09-11 NOTE — H&P (Signed)
? ?                                                                                                       TRH H&P ? ? Patient Demographics:  ? ? Shannon Fleming, is a 36 y.o. female  MRN: [id number]   DOB - 1986-04-12 ? ?Admit Date - 09/11/2021 ? ?Outpatient Primary MD for the patient is Toma Deiters, MD ? ?Referring MD/NP/PA: Dr Hyacinth Meeker   ? ?Patient coming from: home ? ?No chief complaint on file. ?  ? ? HPI:  ? ? Shannon Fleming  is a 36 y.o. 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 ?-Patient presents to ED secondary to complaints of left arm weakness, which she was trying to get into the car when she fell to the ground, where she reported loss of balance, and left-sided weakness, she reports symptoms starting at 4:45 PM patient reports symptoms are still persistence of anything that slightly worsened upon my evaluation.  He denies any chest pain, fever or shortness of breath. ?-NAD work-up including CT head, MRI brain, CTA head and neck with no acute findings, teleneurology were consulted and recommended dual antiplatelet therapy, admission overnight to complete stroke work-up. ? ? ? Review of systems:  ?  ?In addition to the HPI above, . ? ?A full 10 point Review of Systems was done, except as stated above, all other Review of Systems were negative. ? ? ?With Past History of the following :  ? ? ?Past Medical History:  ?Diagnosis Date  ? Medical history non-contributory   ? Stroke [facility])   ?   ? ?Past Surgical History:  ?Procedure Laterality Date  ? NO PAST SURGERIES    ? ? ? ? Social History:  ? ?  ?Social History  ? ?Tobacco Use  ? Smoking status: Never  ? Smokeless tobacco: Never  ?Substance Use Topics  ? Alcohol use: No  ?  ? ? ? Family History :  ? ?Family history reviewed, nonpertinent ? ? Home  Medications:  ? ?Prior to Admission medications   ?Medication Sig Start Date End Date Taking? Authorizing Provider  ?aspirin 81 MG EC tablet Take 1 tablet by mouth daily. 06/14/19  Yes [provider]  ?topiramate (TOPAMAX) 25 MG tablet Take 50 mg by mouth every evening. 01/13/21  Yes [provider]  ?cephALEXin (KEFLEX) 500 MG capsule Take 1 capsule (500 mg total) by mouth 4 (four) times daily. ?Patient not taking: Reported on 09/11/2021 06/28/14   Aviva Signs, CNM  ?promethazine (PHENERGAN) 25 MG suppository Place 1 suppository (25 mg total) rectally every 6 (six) hours as needed for nausea or vomiting. ?Patient not taking: Reported on 09/11/2021 06/25/14   Delbert Phenix, NP  ? ? ? Allergies:  ? ?  ?Allergies  ?Allergen Reactions  ? Hydralazine Swelling  ? ? ? Physical Exam:  ? ?Vitals ? ?Blood pressure  115/76, pulse 75, temperature 98.1 ?F (36.7 ?C), resp. rate 18, height 5\' 3"  (1.6 m), weight 73.5 kg, SpO2 100 %, unknown if currently breastfeeding. ? ? ?1. General well developed female, laying in bed in no apparent distress ? ?2. Normal affect and insight, Not Suicidal or Homicidal, Awake Alert, Oriented X 3. ? ?3. No F.N deficits, ALL C.Nerves Intact, upper extremity 4/5, and she has left upper extremity drift.   ? ?4. Ears and Eyes appear Normal, Conjunctivae clear, PERRLA. Moist Oral Mucosa. ? ?5. Supple Neck, No JVD, No cervical lymphadenopathy appriciated, No Carotid Bruits. ? ?6. Symmetrical Chest wall movement, Good air movement bilaterally, CTAB. ? ?7. RRR,  ? ?8.  Bowel sounds present ?9.  No Cyanosis, Normal Skin Turgor, No Skin Rash or Bruise. ? ?10. Good muscle tone,  joints appear normal , no effusions, Normal ROM. ? ?11. No Palpable Lymph Nodes in Neck or Axillae ? ? ? ? Data Review:  ? ? CBC ?Recent Labs  ?Lab 09/11/21 ?1724 09/11/21 ?1809  ?WBC 6.3  --   ?HGB 13.0 13.6  ?HCT 41.0 40.0  ?PLT 271  --   ?MCV 88.7  --   ?MCH 28.1  --   ?MCHC 31.7  --   ?RDW 13.2  --    ?LYMPHSABS 2.0  --   ?MONOABS 0.4  --   ?EOSABS 0.1  --   ?BASOSABS 0.0  --   ? ?------------------------------------------------------------------------------------------------------------------ ? ?Chemistries  ?Recent Labs  ?Lab 09/11/21 ?1724 09/11/21 ?1809  ?NA 139 141  ?K 4.0 4.1  ?CL 108 107  ?CO2 23  --   ?GLUCOSE 90 87  ?BUN 10 9  ?CREATININE 0.94 0.90  ?CALCIUM 9.4  --   ?AST 19  --   ?ALT 12  --   ?ALKPHOS 41  --   ?BILITOT 0.4  --   ? ?------------------------------------------------------------------------------------------------------------------ ?estimated creatinine clearance is 84.5 mL/min (by C-G formula based on SCr of 0.9 mg/dL). ?------------------------------------------------------------------------------------------------------------------ ?No results for input(s): TSH, T4TOTAL, T3FREE, THYROIDAB in the last 72 hours. ? ?Invalid input(s): FREET3 ? ?Coagulation profile ?Recent Labs  ?Lab 09/11/21 ?1724  ?INR 1.0  ? ?------------------------------------------------------------------------------------------------------------------- ?No results for input(s): DDIMER in the last 72 hours. ?------------------------------------------------------------------------------------------------------------------- ? ?Cardiac Enzymes ?No results for input(s): CKMB, TROPONINI, MYOGLOBIN in the last 168 hours. ? ?Invalid input(s): CK ?------------------------------------------------------------------------------------------------------------------ ?No results found for: BNP ? ? ?--------------------------------------------------------------------------------------------------------------- ? ?Urinalysis ?   ?Component Value Date/Time  ? COLORURINE STRAW (A) 09/11/2021 1850  ? APPEARANCEUR CLEAR 09/11/2021 1850  ? LABSPEC 1.031 (H) 09/11/2021 1850  ? PHURINE 6.0 09/11/2021 1850  ? GLUCOSEU NEGATIVE 09/11/2021 1850  ? HGBUR NEGATIVE 09/11/2021 1850  ? BILIRUBINUR NEGATIVE 09/11/2021 1850  ? Lavenia AtlasKETONESUR NEGATIVE  09/11/2021 1850  ? PROTEINUR NEGATIVE 09/11/2021 1850  ? UROBILINOGEN 1.0 06/25/2014 0908  ? NITRITE NEGATIVE 09/11/2021 1850  ? LEUKOCYTESUR NEGATIVE 09/11/2021 1850  ? ? ?---------------------------------------------------------------------------------------------------------------- ? ? Imaging Results:  ? ? MR BRAIN WO CONTRAST ? ?Result Date: 09/11/2021 ?CLINICAL DATA:  Left-sided weakness, stroke suspected, headache and blurry vision in right eye EXAM: MRI HEAD WITHOUT CONTRAST TECHNIQUE: Multiplanar, multiecho pulse sequences of the brain and surrounding structures were obtained without intravenous contrast. COMPARISON:  No prior MRI, correlation is made with CT head and CTA head neck 09/11/2021 FINDINGS: Brain: No restricted diffusion to suggest acute or subacute infarct. No acute hemorrhage, mass, mass effect, or midline shift. No hydrocephalus or extra-axial collection. No abnormal T2 hyperintense signal in the periventricular or juxtacortical white matter. No  foci of hemosiderin deposition to suggest remote hemorrhage. Normal pituitary. Vascular: Normal flow voids. Skull and upper cervical spine: Normal marrow signal. Sinuses/Orbits: Negative. Other: The mastoids are well aerated. IMPRESSION: No acute intracranial process. Electronically Signed   By: Wiliam Ke M.D.   On: 09/11/2021 19:31  ? ?CT HEAD CODE STROKE WO CONTRAST ? ?Result Date: 09/11/2021 ?CLINICAL DATA:  Code stroke. Neuro deficit, acute, stroke suspected. Left-sided weakness. EXAM: CT HEAD WITHOUT CONTRAST TECHNIQUE: Contiguous axial images were obtained from the base of the skull through the vertex without intravenous contrast. RADIATION DOSE REDUCTION: This exam was performed according to the departmental dose-optimization program which includes automated exposure control, adjustment of the mA and/or kV according to patient size and/or use of iterative reconstruction technique. COMPARISON:  None. FINDINGS: Brain: Normal appearance without  evidence of old or acute infarction, mass lesion, hemorrhage, hydrocephalus or extra-axial collection. Vascular: No calcified plaque or hyperdense vessel. Skull: Normal Sinuses/Orbits: Normal Other: None ASPECTS (

## 2021-09-11 NOTE — ED Notes (Signed)
Pt to MRI

## 2021-09-11 NOTE — ED Notes (Signed)
Pt in CT. Telemonitor activated. ?

## 2021-09-11 NOTE — ED Notes (Signed)
Removed patients gold hoop earrings and placed in her purse. Purse given to family at bedside. ?

## 2021-09-12 ENCOUNTER — Observation Stay (HOSPITAL_COMMUNITY): Payer: Medicaid - Out of State

## 2021-09-12 ENCOUNTER — Other Ambulatory Visit: Payer: Self-pay

## 2021-09-12 ENCOUNTER — Observation Stay (HOSPITAL_BASED_OUTPATIENT_CLINIC_OR_DEPARTMENT_OTHER): Payer: Medicaid - Out of State

## 2021-09-12 ENCOUNTER — Encounter (HOSPITAL_COMMUNITY): Payer: Self-pay | Admitting: Internal Medicine

## 2021-09-12 DIAGNOSIS — G459 Transient cerebral ischemic attack, unspecified: Secondary | ICD-10-CM | POA: Diagnosis not present

## 2021-09-12 DIAGNOSIS — R29818 Other symptoms and signs involving the nervous system: Secondary | ICD-10-CM | POA: Diagnosis not present

## 2021-09-12 DIAGNOSIS — R29898 Other symptoms and signs involving the musculoskeletal system: Secondary | ICD-10-CM | POA: Diagnosis not present

## 2021-09-12 LAB — LIPID PANEL
Cholesterol: 132 mg/dL (ref 0–200)
HDL: 48 mg/dL (ref >40)
LDL Cholesterol: 78 mg/dL (ref 0–99)
Total CHOL/HDL Ratio: 2.8 RATIO
Triglycerides: 30 mg/dL (ref <150)
VLDL: 6 mg/dL (ref 0–40)

## 2021-09-12 LAB — HIV ANTIBODY (ROUTINE TESTING W REFLEX): HIV Screen 4th Generation wRfx: NONREACTIVE

## 2021-09-12 LAB — ECHOCARDIOGRAM COMPLETE
AR max vel: 1.82 cm2
AV Area VTI: 2.03 cm2
AV Area mean vel: 2.13 cm2
AV Mean grad: 3.9 mmHg
AV Peak grad: 9 mmHg
Ao pk vel: 1.5 m/s
Area-P 1/2: 3.61 cm2
Height: 63 in
S' Lateral: 2.6 cm
Weight: 2592 oz

## 2021-09-12 LAB — HEMOGLOBIN A1C
Hgb A1c MFr Bld: 5.1 % (ref 4.8–5.6)
Mean Plasma Glucose: 99.67 mg/dL

## 2021-09-12 IMAGING — MR MR CERVICAL SPINE W/O CM
5 of 6 series · 36 of 48 positions shown · non-contrast
Comparison: CTA neck from yesterday.

CLINICAL DATA: Headache and left-sided weakness. Negative brain
MRI.

EXAM:
MRI CERVICAL SPINE WITHOUT CONTRAST
TECHNIQUE: Multiplanar, multisequence MR imaging of the cervical spine was
performed. No intravenous contrast was administered.

[Series 5: T1 · sagittal · 3.0mm · 0.86mm/px · 5 of 15 slices shown (1 of 2)]
[im 1/15]
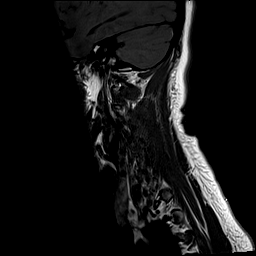
[im 4/15]
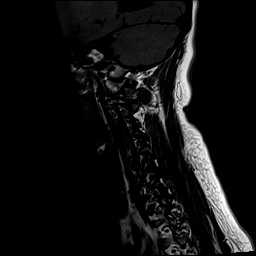
[im 8/15]
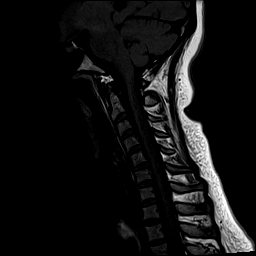
[im 11/15]
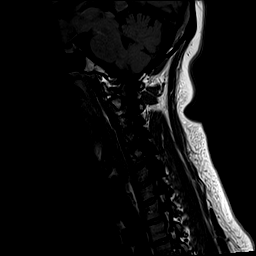
[im 15/15]
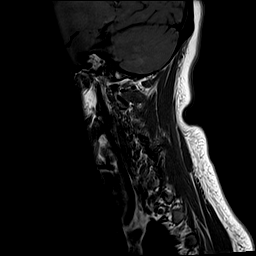

[Series 6: STIR · sagittal · 3.0mm · 0.69mm/px · 4 of 15 slices shown]
[im 1/15]
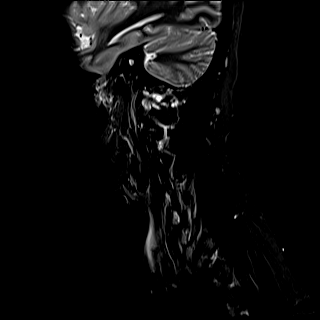
[im 4/15]
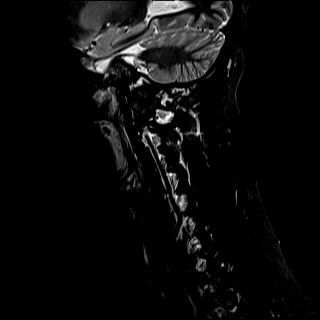
[im 8/15]
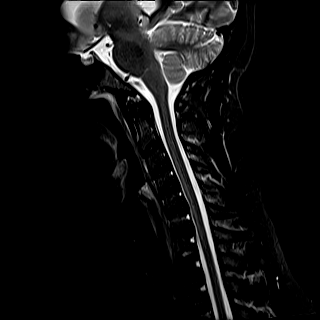
[im 11/15]
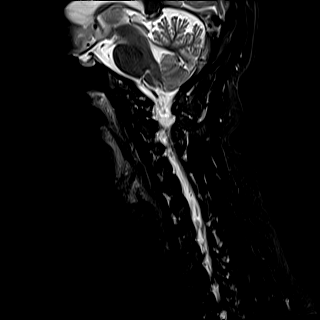

[Series 7: T2 · sagittal · 3.0mm · 0.69mm/px · 5 of 15 slices shown (1 of 2)]
[im 1/15]
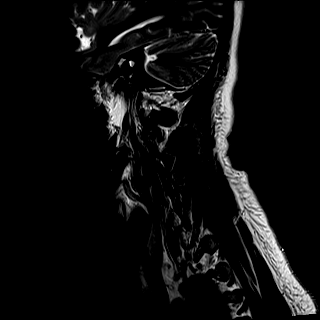
[im 4/15]
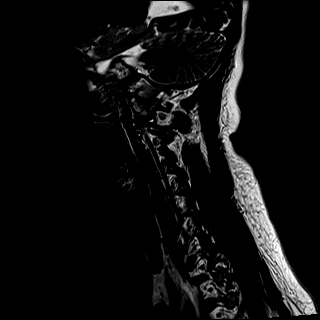
[im 8/15]
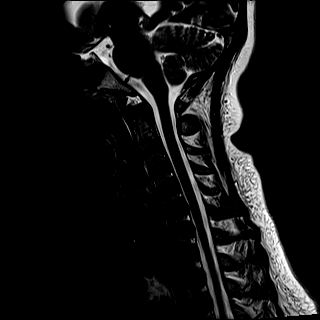
[im 11/15]
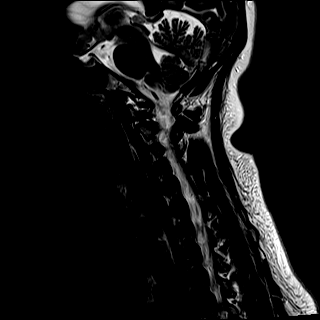
[im 15/15]
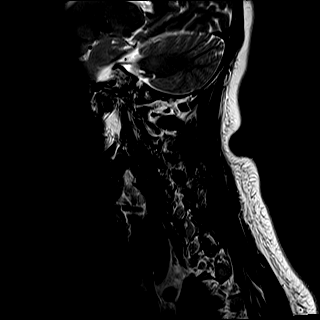

[Series 9: T1 · axial · 3.0mm · 0.35mm/px · z∈[-42,+52]mm · 11 of 30 slices shown (2 of 2)]
[im 1/30]
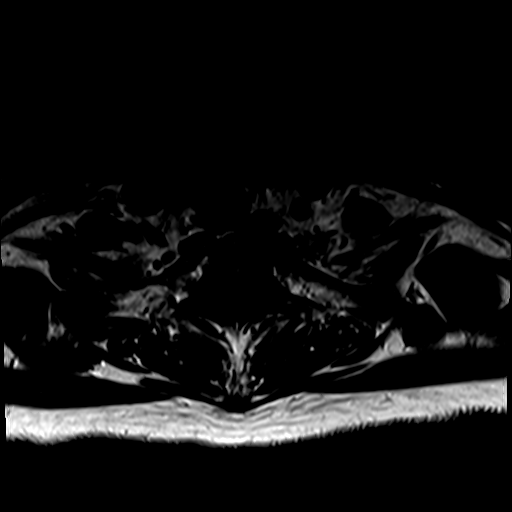
[im 3/30]
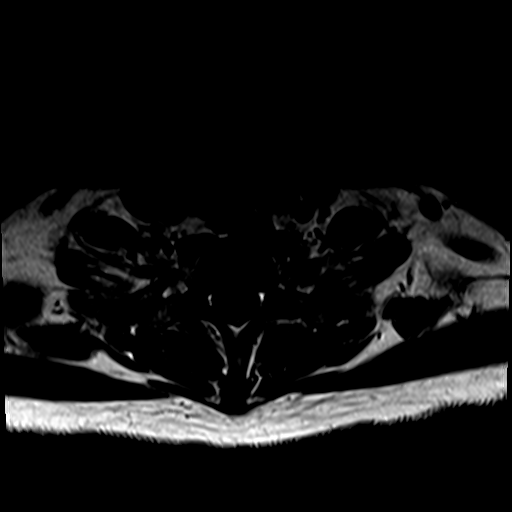
[im 6/30]
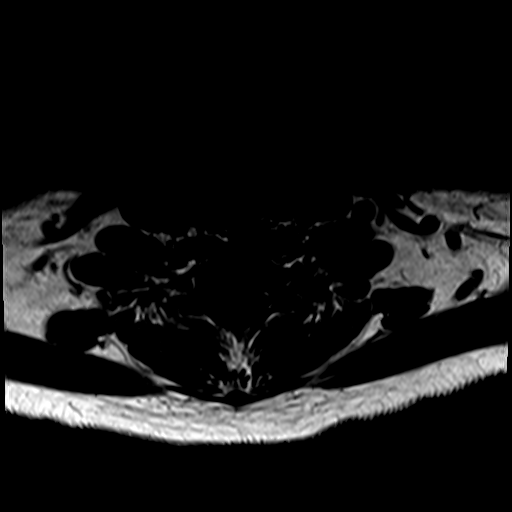
[im 9/30]
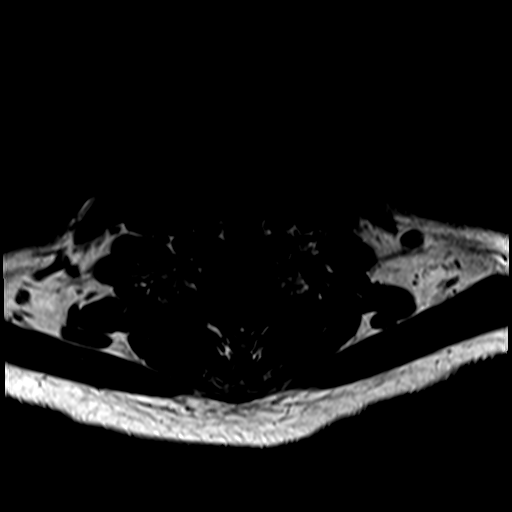
[im 12/30]
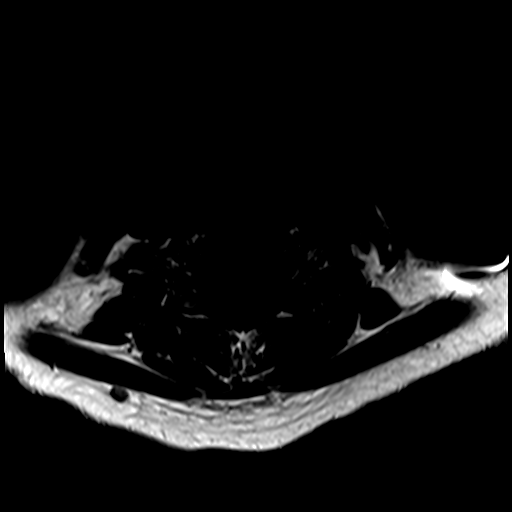
[im 15/30]
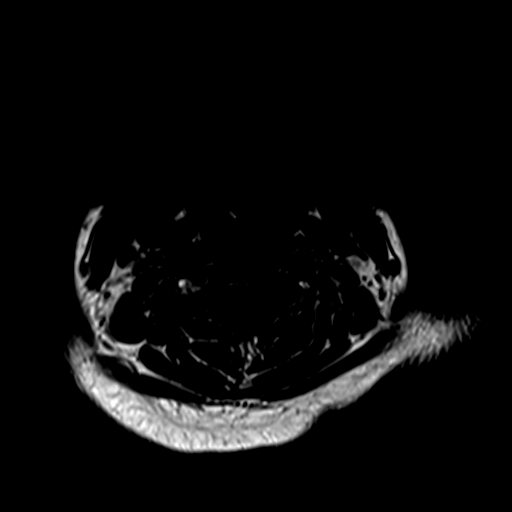
[im 18/30]
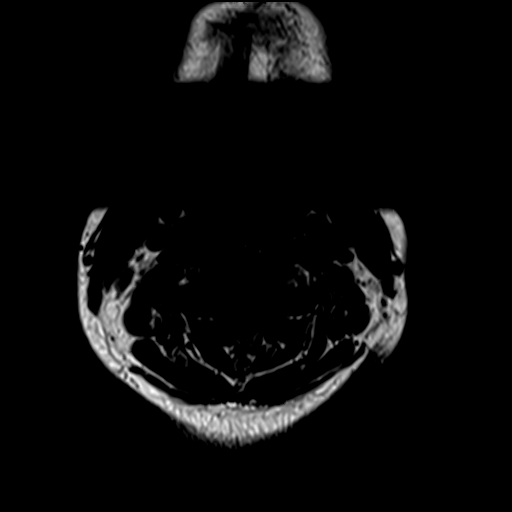
[im 21/30]
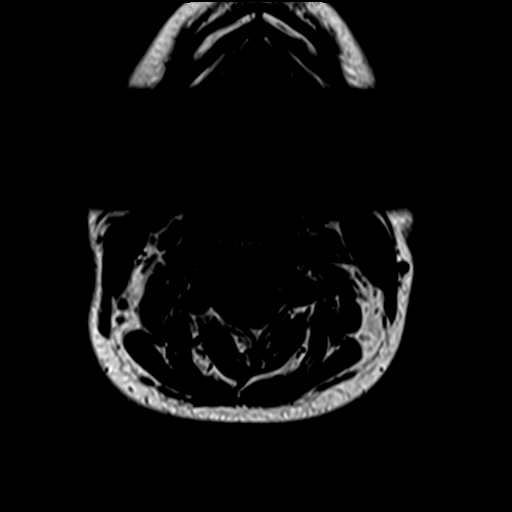
[im 24/30]
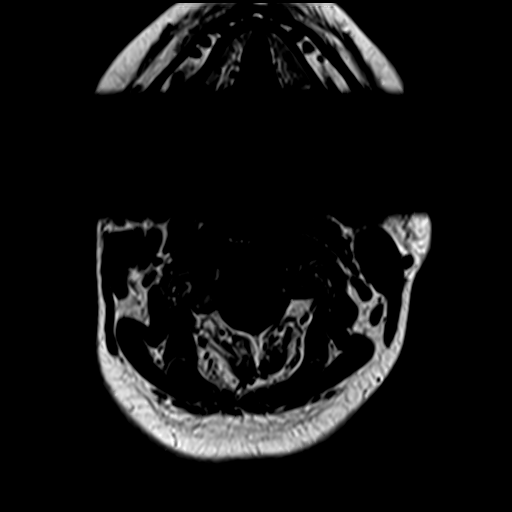
[im 27/30]
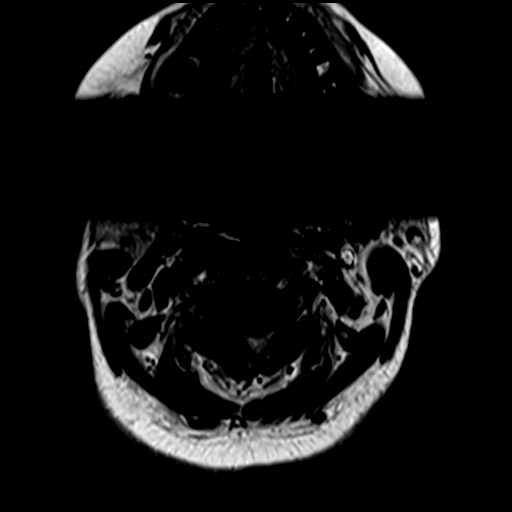
[im 30/30]
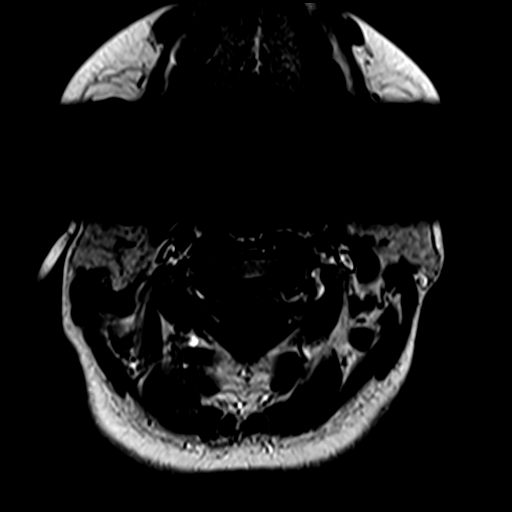

[Series 14: T2 · axial · 3.0mm · 0.70mm/px · z∈[+13,+107]mm · 11 of 30 slices shown (2 of 2)]
[im 1/30]
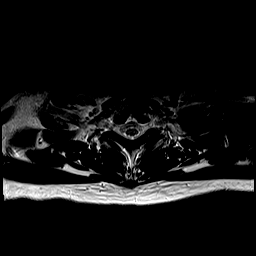
[im 3/30]
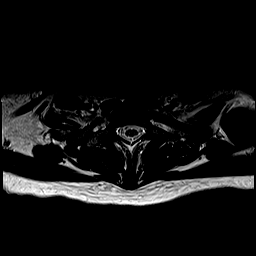
[im 6/30]
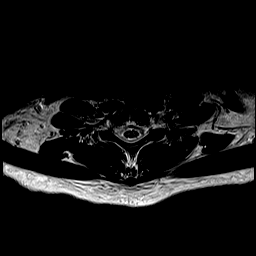
[im 9/30]
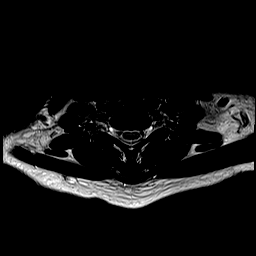
[im 12/30]
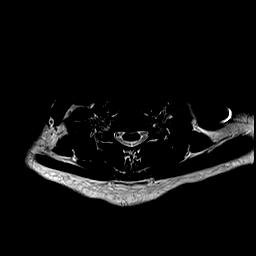
[im 15/30]
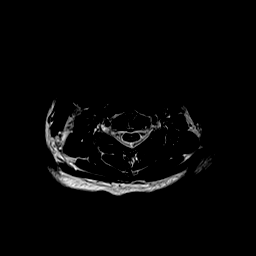
[im 18/30]
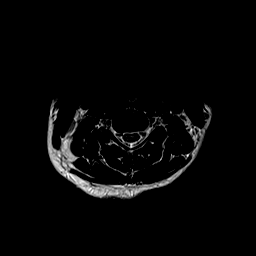
[im 21/30]
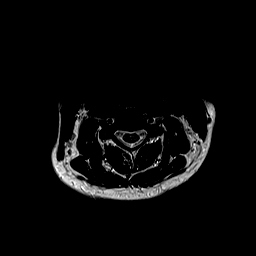
[im 24/30]
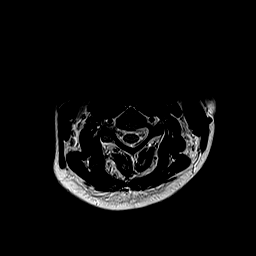
[im 27/30]
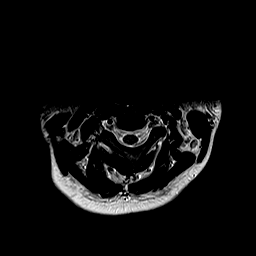
[im 30/30]
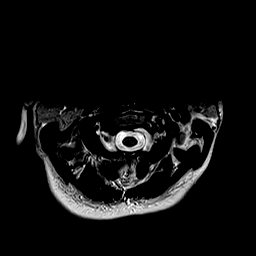

[36 of 48 positions shown; findings below may reference images not displayed]

FINDINGS: Alignment: Mild reversal of the normal cervical lordosis. No
listhesis.

Vertebrae: No fracture, evidence of discitis, or bone lesion.

Cord: Normal signal and morphology.

Posterior Fossa, vertebral arteries, paraspinal tissues: Negative.

Disc levels:

C2-C3:  Negative.

C3-C4:  Negative.

C4-C5: Negative disc. Mild right neuroforaminal stenosis due to
uncovertebral hypertrophy. No spinal canal or left neuroforaminal
stenosis.

C5-C6:  Negative.

C6-C7:  Negative.

C7-T1:  Negative.
IMPRESSION: 1. No acute abnormality or significant degenerative changes.

## 2021-09-12 NOTE — Progress Notes (Signed)
?  Transition of Care (TOC) Screening Note ? ? ?Patient Details  ?Name: Shannon Fleming ?Date of Birth: 07-14-1985 ? ? ?Transition of Care (TOC) CM/SW Contact:    ?Villa Herb, LCSWA ?Phone Number: ?09/12/2021, 11:07 AM ? ? ? ?Transition of Care Department [facility]) has reviewed patient and no TOC needs have been identified at this time. We will continue to monitor patient advancement through interdisciplinary progression rounds. If new patient transition needs arise, please place a TOC consult. ?  ?

## 2021-09-12 NOTE — Evaluation (Signed)
Occupational Therapy Evaluation ?Patient Details ?Name: Shannon Fleming ?MRN: [id number] ?DOB: Mar 11, 1986 ?Today's Date: 09/12/2021 ? ? ?History of Present Illness Shannon Fleming  is a 36 y.o. 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  -Patient presents to ED secondary to complaints of left arm weakness, which she was trying to get into the car when she fell to the ground, where she reported loss of balance, and left-sided weakness, she reports symptoms starting at 4:45 PM patient reports symptoms are still persistence of anything that slightly worsened upon my evaluation.  He denies any chest pain, fever or shortness of breath. (taken per MD note)  ? ?Clinical Impression ?  ?Pt agreeable to OT evaluation. Pt is independent at baseline but reports L eye blurry vision and weakness in L UE. L UE weakness was confirmed with MMT testing. Functionally the pt is operating at a level of independent for ADL's and transfers, but weakness in L UE and blurry vision do persist. Outpatient OT recommended if symptoms persist. Pt is not recommended for further acute OT services and will be discharged to care of nursing staff for remaining length of stay.  ? ?   ? ?Recommendations for follow up therapy are one component of a multi-disciplinary discharge planning process, led by the attending physician.  Recommendations may be updated based on patient status, additional functional criteria and insurance authorization.  ? ?Follow Up Recommendations ? Outpatient OT  ?  ?Assistance Recommended at Discharge PRN  ?Patient can return home with the following   ? ?  ?Functional Status Assessment ? Patient has had a recent decline in their functional status and demonstrates the ability to make significant improvements in function in a  reasonable and predictable amount of time.  ?Equipment Recommendations ? None recommended by OT  ?  ?Recommendations for Other Services Other (comment) (vision specialist evaluation) ? ? ?  ?Precautions / Restrictions Precautions ?Precautions: None ?Restrictions ?Weight Bearing Restrictions: No  ? ?  ? ?Mobility Bed Mobility ?Overal bed mobility: Independent ?  ?  ?  ?  ?  ?  ?  ?  ? ?Transfers ?Overall transfer level: Independent ?  ?  ?  ?  ?  ?  ?  ?  ?  ?  ? ?  ?Balance Overall balance assessment: Independent ?  ?  ?  ?  ?  ?  ?  ?  ?  ?  ?  ?  ?  ?  ?  ?  ?  ?  ?   ? ?ADL either performed or assessed with clinical judgement  ? ?ADL Overall ADL's : Independent ?  ?  ?  ?  ?  ?  ?  ?  ?  ?  ?  ?  ?  ?  ?  ?  ?  ?  ?  ?   ? ? ? ?Vision Baseline Vision/History: 1 Wears glasses (Reports that she is supossed to wear glasses for stigmatism.) ?Ability to See in Adequate Light: 1 Impaired ?Patient Visual Report: Blurring of vision ?Vision Assessment?: Yes ?Eye Alignment: Within Functional Limits ?Alignment/Gaze Preference: Within Defined Limits ?Tracking/Visual Pursuits: Able to track stimulus in all quads without difficulty ?Visual Fields: No apparent deficits ?Additional Comments: Pt did not show deficits during testing but reportedly is having increased blurry vision only in L eye.  ?   ?   ?  ?   ?  ? ?  Pertinent Vitals/Pain Pain Assessment ?Pain Assessment: 0-10 ?Pain Score: 5  ?Pain Location: posterior neck ?Pain Descriptors / Indicators: Throbbing ?Pain Intervention(s): Limited activity within patient's tolerance, Monitored during session  ? ? ? ?Hand Dominance Right ?  ?Extremity/Trunk Assessment Upper Extremity Assessment ?Upper Extremity Assessment: LUE deficits/detail ?LUE Deficits / Details: 4/5 shoulder, elbbow, and wrist. 4-/5 grip. ?LUE Sensation: WNL ?LUE Coordination: WNL ?  ?Lower Extremity Assessment ?Lower Extremity Assessment: Defer to PT evaluation ?  ?Cervical / Trunk Assessment ?Cervical / Trunk  Assessment: Normal ?  ?Communication Communication ?Communication: No difficulties ?  ?Cognition Arousal/Alertness: Awake/alert ?Behavior During Therapy: [facility] for tasks assessed/performed ?Overall Cognitive Status: Within Functional Limits for tasks assessed ?  ?  ?  ?  ?  ?  ?  ?  ?  ?  ?  ?  ?  ?  ?  ?  ?  ?  ?  ?   ? ?  ?   ?  ?    ? ? ?Home Living Family/patient expects to be discharged to:: Private residence ?Living Arrangements: Alone;Children (Takes care of children.) ?Available Help at Discharge: Family ?Type of Home: House ?Home Access: Stairs to enter ?Entrance Stairs-Number of Steps: 1 (side entrance 4 to 5) ?Entrance Stairs-Rails: Can reach both (for side entrance. no rails for 1 step in front) ?Home Layout: Two level;Laundry or work area in basement ?Alternate Level Stairs-Number of Steps: 13 ?Alternate Level Stairs-Rails: Right;Left (L then switches to R) ?Bathroom Shower/Tub: Psychologist, counselling;Tub/shower unit ?  ?Bathroom Toilet: Standard ?  ?  ?Home Equipment: None ?  ?  ?  ? ?  ?Prior Functioning/Environment Prior Level of Function : Independent/Modified Independent ?  ?  ?  ?  ?  ?  ?  ?  ?  ? ?  ?  ?   ?  ?   ?    ?  ?OT Goals(Current goals can be found in the care plan section) Acute Rehab OT Goals ?Patient Stated Goal: return home  ?OT Frequency:   ?  ? ?   ?  ?  ?  ?  ? ?  ?AM-PAC OT "6 Clicks" Daily Activity     ?Outcome Measure Help from another person eating meals?: None ?Help from another person taking care of personal grooming?: None ?Help from another person toileting, which includes using toliet, bedpan, or urinal?: None ?Help from another person bathing (including washing, rinsing, drying)?: None ?Help from another person to put on and taking off regular upper body clothing?: None ?Help from another person to put on and taking off regular lower body clothing?: None ?6 Click Score: 24 ?  ?End of Session   ? ?Activity Tolerance: Patient tolerated treatment well ?Patient left: in bed;with call  bell/phone within reach ? ?OT Visit Diagnosis: Muscle weakness (generalized) (M62.81)  ?              ?Time: 0623-7628 ?OT Time Calculation (min): 11 min ?Charges:  OT General Charges ?$OT Visit: 1 Visit ?OT Evaluation ?$OT Eval Low Complexity: 1 Low ? ?Danie Chandler OT, MOT ? ?Danie Chandler ?09/12/2021, 12:07 PM ?

## 2021-09-12 NOTE — Plan of Care (Signed)
Brief phone note, documenting discussion with Dr. Kerry Hough and chart review ? ?Current Facility-Administered Medications:  ?  0.9 %  sodium chloride infusion, , Intravenous, Continuous, Elgergawy, Leana Roe, MD, Last Rate: 75 mL/hr at 09/12/21 1003, New Bag at 09/12/21 1003 ?  acetaminophen (TYLENOL) tablet 650 mg, 650 mg, Oral, Q4H PRN **OR** acetaminophen (TYLENOL) 160 MG/5ML solution 650 mg, 650 mg, Per Tube, Q4H PRN **OR** acetaminophen (TYLENOL) suppository 650 mg, 650 mg, Rectal, Q4H PRN, Elgergawy, Leana Roe, MD ?  aspirin EC tablet 81 mg, 81 mg, Oral, Daily, Elgergawy, Leana Roe, MD, 81 mg at 09/12/21 1000 ?  clopidogrel (PLAVIX) tablet 75 mg, 75 mg, Oral, Daily, Elgergawy, Leana Roe, MD, 75 mg at 09/12/21 1000 ?  enoxaparin (LOVENOX) injection 40 mg, 40 mg, Subcutaneous, Q24H, Elgergawy, Leana Roe, MD ?  topiramate (TOPAMAX) tablet 50 mg, 50 mg, Oral, QPM, Elgergawy, Leana Roe, MD, 50 mg at 09/11/21 2245  ? ?Current vital signs: ?BP 109/71 (BP Location: Left Arm)   Pulse 74   Temp 98.9 ?F (37.2 ?C) (Oral)   Resp 20   Ht 5\' 3"  (1.6 m)   Wt 73.5 kg   SpO2 100%   BMI 28.70 kg/m?  ?Vital signs in last 24 hours: ?Temp:  [98 ?F (36.7 ?C)-98.9 ?F (37.2 ?C)] 98.9 ?F (37.2 ?C) (03/17 09-21-1981) ?Pulse Rate:  [70-101] 74 (03/17 0832) ?Resp:  [13-28] 20 (03/17 09-21-1981) ?BP: (84-130)/(39-91) 109/71 (03/17 09-21-1981) ?SpO2:  [99 %-100 %] 100 % (03/17 0832) ?Weight:  [73.5 kg] 73.5 kg (03/16 1712) ? ? ?Current Outpatient Medications  ?Medication Instructions  ? aspirin 81 MG EC tablet 1 tablet, Oral, Daily  ? cephALEXin (KEFLEX) 500 mg, Oral, 4 times daily  ? promethazine (PHENERGAN) 25 mg, Rectal, Every 6 hours PRN  ? topiramate (TOPAMAX) 50 mg, Oral, Every evening  ?  ?LDL 78 ?No results found for: HGBA1C  (pending) ? ?ECHO read pending ? ?MRI personally reviewed, agree with radiology, no acute intracranial process  ?CTA personally reviewed,.agree with radiology, negative for acute process ? ?Recommendations:  ?- Primary team or PCP  to follow-up ECHO ?- No need to continue plavix given no stroke on imaging obtained while patient continued to have had symptoms for hours, and likely complicated migraine -- would not classify this event as a TIA  ?- LDL close to goal at 78, defer risk/benefit discussion of re-initiation of statin to her outpatient team ?- Reasonable to obtain MRI C-spine to rule out cervical process given history of Ehler's Danlos though this would not be expected to explain unilateral blurred vision ?- Given functional assessment documented in PT notes, if MRI C-spine is reassuring, appropriate for outpatient follow-up with return for worsening symptoms  ?  ?No charge for plan of care note ?05-04-1984 MD-PhD ?[facility] ?[phone]  ?

## 2021-09-12 NOTE — Plan of Care (Signed)
?  Problem: Education: ?Goal: Knowledge of General Education information will improve ?Description: Including pain rating scale, medication(s)/side effects and non-pharmacologic comfort measures ?Outcome: Progressing ?  ?Problem: Health Behavior/Discharge Planning: ?Goal: Ability to manage health-related needs will improve ?Outcome: Progressing ?  ?Problem: Clinical Measurements: ?Goal: Ability to maintain clinical measurements within normal limits will improve ?Outcome: Progressing ?Goal: Will remain free from infection ?Outcome: Progressing ?Goal: Diagnostic test results will improve ?Outcome: Progressing ?Goal: Respiratory complications will improve ?Outcome: Progressing ?Goal: Cardiovascular complication will be avoided ?Outcome: Progressing ?  ?Problem: Activity: ?Goal: Risk for activity intolerance will decrease ?Outcome: Progressing ?  ?Problem: Nutrition: ?Goal: Adequate nutrition will be maintained ?Outcome: Progressing ?  ?Problem: Coping: ?Goal: Level of anxiety will decrease ?Outcome: Progressing ?  ?Problem: Elimination: ?Goal: Will not experience complications related to bowel motility ?Outcome: Progressing ?Goal: Will not experience complications related to urinary retention ?Outcome: Progressing ?  ?Problem: Pain Managment: ?Goal: General experience of comfort will improve ?Outcome: Progressing ?  ?Problem: Safety: ?Goal: Ability to remain free from injury will improve ?Outcome: Progressing ?  ?Problem: Education: ?Goal: Knowledge of disease or condition will improve ?Outcome: Progressing ?Goal: Knowledge of secondary prevention will improve (SELECT ALL) ?Outcome: Progressing ?Goal: Individualized Educational Video(s) ?Outcome: Progressing ?  ?Problem: Coping: ?Goal: Will verbalize positive feelings about self ?Outcome: Progressing ?Goal: Will identify appropriate support needs ?Outcome: Progressing ?  ?

## 2021-09-12 NOTE — Plan of Care (Signed)
?  Problem: Education: ?Goal: Knowledge of General Education information will improve ?Description: Including pain rating scale, medication(s)/side effects and non-pharmacologic comfort measures ?Outcome: Completed/Met ?  ?Problem: Education: ?Goal: Knowledge of disease or condition will improve ?Outcome: Progressing ?Goal: Knowledge of secondary prevention will improve (SELECT ALL) ?Outcome: Progressing ?  ?Problem: Coping: ?Goal: Will verbalize positive feelings about self ?Outcome: Progressing ?Goal: Will identify appropriate support needs ?Outcome: Progressing ?  ?Problem: Education: ?Goal: Knowledge of disease or condition will improve ?Outcome: Progressing ?  ?Problem: Ischemic Stroke/TIA Tissue Perfusion: ?Goal: Complications of ischemic stroke/TIA will be minimized ?Outcome: Progressing ?  ?

## 2021-09-12 NOTE — Progress Notes (Signed)
Nsg Discharge Note ? ?Admit Date:  09/11/2021 ?Discharge date: 09/12/2021 ?  ?Verdis Prime to be D/C'd Home per MD order.  AVS completed. ?Patient/caregiver able to verbalize understanding. ? ?Discharge Medication: ?Allergies as of 09/12/2021   ? ?   Reactions  ? Hydralazine Swelling  ? ?  ? ?  ?Medication List  ?  ? ?STOP taking these medications   ? ?cephALEXin 500 MG capsule ?Commonly known as: KEFLEX ?  ?promethazine 25 MG suppository ?Commonly known as: Phenergan ?  ? ?  ? ?TAKE these medications   ? ?aspirin 81 MG EC tablet ?Take 1 tablet by mouth daily. ?  ?topiramate 25 MG tablet ?Commonly known as: TOPAMAX ?Take 50 mg by mouth every evening. ?  ? ?  ? ? ?Discharge Assessment: ?Vitals:  ? 09/12/21 1228 09/12/21 1601  ?BP: 108/66 119/80  ?Pulse: 84 93  ?Resp: 17 20  ?Temp:  98.4 ?F (36.9 ?C)  ?SpO2: 100% 100%  ? Skin clean, dry and intact without evidence of skin break down, no evidence of skin tears noted. ?IV catheter discontinued intact. Site without signs and symptoms of complications - no redness or edema noted at insertion site, patient denies c/o pain - only slight tenderness at site.  Dressing with slight pressure applied. ? ?D/c Instructions-Education: ?Discharge instructions given to patient/family with verbalized understanding. ?D/c education completed with patient/family including follow up instructions, medication list, d/c activities limitations if indicated, with other d/c instructions as indicated by MD - patient able to verbalize understanding, all questions fully answered. ?Patient instructed to return to ED, call 911, or call MD for any changes in condition.  ?Patient escorted via WC, and D/C home via private auto. ? ?Luther Bradley, RN ?09/12/2021 6:33 PM   ?

## 2021-09-12 NOTE — Progress Notes (Signed)
PT Cancellation Note ? ?Patient Details ?Name: Shannon Fleming ?MRN: [id number] ?DOB: 10/31/1985 ? ? ?Cancelled Treatment:    Reason Eval/Treat Not Completed: PT screened, no needs identified, will sign off; Spoke with patient who stated she did not have lower extremity deficit and has been ambulating independently. Patient does not require PT services at this time. ? ?12:28 PM, 09/12/21 ?Wyman Songster PT, DPT ?Physical Therapist at [facility] ?[facility] ? ?

## 2021-09-12 NOTE — Progress Notes (Signed)
SLP Cancellation Note ? ?Patient Details ?Name: Shannon Fleming ?MRN: [id number] ?DOB: July 06, 1985 ? ? ?Cancelled treatment:       Reason Eval/Treat Not Completed: SLP screened, no needs identified, will sign off. No changes in speech, language or cognition, Pt is functioning at baseline in these areas. ST will sign off. Thank you, ? ?Humza Tallerico H. Izora Ribas MA, CCC-SLP ?Speech Language Pathologist ? ? ? ?Shannon Fleming ?09/12/2021, 9:52 AM ?

## 2021-09-12 NOTE — Progress Notes (Signed)
Have notified Dr. Roderic Palau of patient's questions, states he will be up to see her. ?

## 2021-09-12 NOTE — Plan of Care (Signed)
?  Problem: Education: ?Goal: Knowledge of General Education information will improve ?Description: Including pain rating scale, medication(s)/side effects and non-pharmacologic comfort measures ?Outcome: Completed/Met ?  ?Problem: Health Behavior/Discharge Planning: ?Goal: Ability to manage health-related needs will improve ?Outcome: Adequate for Discharge ?  ?Problem: Clinical Measurements: ?Goal: Ability to maintain clinical measurements within normal limits will improve ?Outcome: Adequate for Discharge ?Goal: Will remain free from infection ?Outcome: Adequate for Discharge ?Goal: Diagnostic test results will improve ?Outcome: Adequate for Discharge ?Goal: Respiratory complications will improve ?Outcome: Adequate for Discharge ?Goal: Cardiovascular complication will be avoided ?Outcome: Adequate for Discharge ?  ?Problem: Activity: ?Goal: Risk for activity intolerance will decrease ?Outcome: Adequate for Discharge ?  ?Problem: Nutrition: ?Goal: Adequate nutrition will be maintained ?Outcome: Adequate for Discharge ?  ?Problem: Coping: ?Goal: Level of anxiety will decrease ?Outcome: Adequate for Discharge ?  ?Problem: Elimination: ?Goal: Will not experience complications related to bowel motility ?Outcome: Adequate for Discharge ?Goal: Will not experience complications related to urinary retention ?Outcome: Adequate for Discharge ?  ?Problem: Pain Managment: ?Goal: General experience of comfort will improve ?Outcome: Adequate for Discharge ?  ?Problem: Safety: ?Goal: Ability to remain free from injury will improve ?Outcome: Adequate for Discharge ?  ?Problem: Education: ?Goal: Knowledge of disease or condition will improve ?09/12/2021 1744 by Santa Lighter, RN ?Outcome: Adequate for Discharge ?09/12/2021 1739 by Santa Lighter, RN ?Outcome: Progressing ?Goal: Knowledge of secondary prevention will improve (SELECT ALL) ?09/12/2021 1744 by Santa Lighter, RN ?Outcome: Adequate for Discharge ?09/12/2021 1739 by Santa Lighter, RN ?Outcome: Progressing ?Goal: Individualized Educational Video(s) ?Outcome: Adequate for Discharge ?  ?Problem: Coping: ?Goal: Will verbalize positive feelings about self ?09/12/2021 1744 by Santa Lighter, RN ?Outcome: Adequate for Discharge ?09/12/2021 1739 by Santa Lighter, RN ?Outcome: Progressing ?Goal: Will identify appropriate support needs ?09/12/2021 1744 by Santa Lighter, RN ?Outcome: Adequate for Discharge ?09/12/2021 1739 by Santa Lighter, RN ?Outcome: Progressing ?  ?Problem: Education: ?Goal: Knowledge of disease or condition will improve ?09/12/2021 1744 by Santa Lighter, RN ?Outcome: Adequate for Discharge ?09/12/2021 1741 by Santa Lighter, RN ?Outcome: Progressing ?Goal: Knowledge of secondary prevention will improve (SELECT ALL) ?Outcome: Adequate for Discharge ?  ?Problem: Ischemic Stroke/TIA Tissue Perfusion: ?Goal: Complications of ischemic stroke/TIA will be minimized ?09/12/2021 1744 by Santa Lighter, RN ?Outcome: Adequate for Discharge ?09/12/2021 1741 by Santa Lighter, RN ?Outcome: Progressing ?  ?

## 2021-09-12 NOTE — TOC Transition Note (Signed)
Transition of Care (TOC) - CM/SW Discharge Note ? ? ?Patient Details  ?Name: Beyonce Sawatzky ?MRN: [id number] ?Date of Birth: 1985-07-23 ? ?Transition of Care (TOC) CM/SW Contact:  ?Elliot Gault, LCSW ?Phone Number: ?09/12/2021, 3:46 PM ? ? ?Clinical Narrative:    ? ?OT recommending outpatient OT at dc. Discussed with pt who requests referral to [facility] at [facility]. Contacted the office and faxed referral information. They will follow up with pt to schedule pt's treatment. ? ?Final next level of care: OP Rehab ?Barriers to Discharge: Continued Medical Work up ? ? ?Patient Goals and CMS Choice ?Patient states their goals for this hospitalization and ongoing recovery are:: go home ?CMS Medicare.gov Compare Post Acute Care list provided to:: Patient ?Choice offered to / list presented to : Patient ? ?Discharge Placement ?  ?           ?  ?  ?  ?  ? ?Discharge Plan and Services ?  ?  ?           ?  ?  ?  ?  ?  ?  ?  ?  ?  ?  ? ?Social Determinants of Health (SDOH) Interventions ?  ? ? ?Readmission Risk Interventions ?No flowsheet data found. ? ? ? ? ?

## 2021-09-12 NOTE — Plan of Care (Signed)
?  Problem: Education: ?Goal: Knowledge of General Education information will improve ?Description: Including pain rating scale, medication(s)/side effects and non-pharmacologic comfort measures ?Outcome: Completed/Met ?  ?Problem: Education: ?Goal: Knowledge of disease or condition will improve ?Outcome: Progressing ?Goal: Knowledge of secondary prevention will improve (SELECT ALL) ?Outcome: Progressing ?  ?Problem: Coping: ?Goal: Will verbalize positive feelings about self ?Outcome: Progressing ?Goal: Will identify appropriate support needs ?Outcome: Progressing ?  ?

## 2021-09-12 NOTE — Discharge Summary (Signed)
Physician Discharge Summary  Shannon Fleming JYN:[id number] DOB: 1986/01/28 DOA: 09/11/2021  PCP: Toma Deiters, MD  Admit date: 09/11/2021 Discharge date: 09/12/2021  Admitted From: Home Disposition: Home  Recommendations for Outpatient Follow-up:  Follow-up with outpatient neurologist, Dr. Clent Ridges in [place] in next 1 to 2 weeks   Discharge Condition: Stable CODE STATUS: Full code Diet recommendation: Heart healthy  Brief/Interim Summary: 36 year old female with past medical history of spontaneous right vertebral artery dissection, atypical migraines, history of PFO s/p closure in 2021, presents to the emergency room with left arm weakness, blurry vision in her left eye.  She underwent CT head, MRI brain, CTA head and neck, that did not show any acute findings.  She was seen by teleneurology who recommended admission for observation and further work-up.  Echocardiogram was also performed that did not show any acute findings.  She underwent MRI of the C-spine did not show any significant findings that would explain her symptoms.  Her symptoms may be related to complicated migraine, but her neuroimaging has been unremarkable.  She can follow-up with her primary neurologist in the next 1 to 2 weeks for further work-up.  She is recommended to continue her aspirin.  Since she did not have any evidence of infarct and symptoms not related to TIA, dual antiplatelet therapy was not recommended at this time.  LDL noted to be at 78, would defer risk/benefit discussion of reinitiation of statin to her outpatient team.  Patient is otherwise stable for discharge.  Discharge Diagnoses:  Principal Problem:   Focal neurological deficit    Discharge Instructions  Discharge Instructions     Ambulatory referral to Occupational Therapy   Complete by: As directed    Left hand weakness Evaluate and treat   Care order/instruction   Complete by: As directed    Outpatient occupational therapy  referral Left arm weakness Please evaluate and treat   Diet - low sodium heart healthy   Complete by: As directed    Increase activity slowly   Complete by: As directed       Allergies as of 09/12/2021       Reactions   Hydralazine Swelling        Medication List     STOP taking these medications    cephALEXin 500 MG capsule Commonly known as: KEFLEX   promethazine 25 MG suppository Commonly known as: Phenergan       TAKE these medications    aspirin 81 MG EC tablet Take 1 tablet by mouth daily.   topiramate 25 MG tablet Commonly known as: TOPAMAX Take 50 mg by mouth every evening.        Follow-up Information     [facility] Follow up.   Why: Staff at [facility] will call you to schedule an evaluation.        Frederich Balding, MD. Schedule an appointment as soon as possible for a visit in 2 week(s).   Specialty: Neurology Contact information: [address] [place] [address] [phone]                Allergies  Allergen Reactions   Hydralazine Swelling    Consultations: Neurology   Procedures/Studies: MR BRAIN WO CONTRAST  Result Date: 09/11/2021 CLINICAL DATA:  Left-sided weakness, stroke suspected, headache and blurry vision in right eye EXAM: MRI HEAD WITHOUT CONTRAST TECHNIQUE: Multiplanar, multiecho pulse sequences of the brain and surrounding structures were obtained without intravenous contrast. COMPARISON:  No prior  MRI, correlation is made with CT head and CTA head neck 09/11/2021 FINDINGS: Brain: No restricted diffusion to suggest acute or subacute infarct. No acute hemorrhage, mass, mass effect, or midline shift. No hydrocephalus or extra-axial collection. No abnormal T2 hyperintense signal in the periventricular or juxtacortical white matter. No foci of hemosiderin deposition to suggest remote hemorrhage. Normal pituitary. Vascular: Normal flow voids. Skull and upper cervical  spine: Normal marrow signal. Sinuses/Orbits: Negative. Other: The mastoids are well aerated. IMPRESSION: No acute intracranial process. Electronically Signed   By: Wiliam Ke M.D.   On: 09/11/2021 19:31   MR CERVICAL SPINE WO CONTRAST  Result Date: 09/12/2021 CLINICAL DATA:  Headache and left-sided weakness. Negative brain MRI. EXAM: MRI CERVICAL SPINE WITHOUT CONTRAST TECHNIQUE: Multiplanar, multisequence MR imaging of the cervical spine was performed. No intravenous contrast was administered. COMPARISON:  CTA neck from yesterday. FINDINGS: Alignment: Mild reversal of the normal cervical lordosis. No listhesis. Vertebrae: No fracture, evidence of discitis, or bone lesion. Cord: Normal signal and morphology. Posterior Fossa, vertebral arteries, paraspinal tissues: Negative. Disc levels: C2-C3:  Negative. C3-C4:  Negative. C4-C5: Negative disc. Mild right neuroforaminal stenosis due to uncovertebral hypertrophy. No spinal canal or left neuroforaminal stenosis. C5-C6:  Negative. C6-C7:  Negative. C7-T1:  Negative. IMPRESSION: 1. No acute abnormality or significant degenerative changes. Electronically Signed   By: Obie Dredge M.D.   On: 09/12/2021 15:10   ECHOCARDIOGRAM COMPLETE  Result Date: 09/12/2021    ECHOCARDIOGRAM REPORT   Patient Name:   Shannon Fleming Date of Exam: 09/12/2021 Medical Rec #:  [id number]        Height:       63.0 in Accession #:    [id number]       Weight:       162.0 lb Date of Birth:  December 15, 1985        BSA:          1.768 m Patient Age:    35 years         BP:           109/71 mmHg Patient Gender: F                HR:           71 bpm. [facility] Procedure: 2D Echo, Color Doppler and Cardiac Doppler Indications:    TIA  History:        Patient has prior history of Echocardiogram examinations, most                 recent 02/15/2020. 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 PFO                 closure in February 2021.  Sonographer:    Irving Burton Senior RDCS Referring Phys:  [id number] IMPRESSIONS  1. Left ventricular ejection fraction, by estimation, is 60 to 65%. The left ventricle has normal function. The left ventricle has no regional wall motion abnormalities. Left ventricular diastolic parameters were normal.  2. Right ventricular systolic function is normal. The right ventricular size is normal. Tricuspid regurgitation signal is inadequate for assessing PA pressure.  3. PFO closure device unknown type is in position, well seated without evidence of shunting.  4. The mitral valve is normal in structure. No evidence of mitral valve regurgitation. No evidence of mitral stenosis.  5. The aortic valve has an indeterminant number of cusps. Aortic valve regurgitation is not visualized. No aortic stenosis is present.  6. The  inferior vena cava is normal in size with greater than 50% respiratory variability, suggesting right atrial pressure of 3 mmHg. FINDINGS  Left Ventricle: Left ventricular ejection fraction, by estimation, is 60 to 65%. The left ventricle has normal function. The left ventricle has no regional wall motion abnormalities. The left ventricular internal cavity size was normal in size. There is  no left ventricular hypertrophy. Left ventricular diastolic parameters were normal. Right Ventricle: The right ventricular size is normal. No increase in right ventricular wall thickness. Right ventricular systolic function is normal. Tricuspid regurgitation signal is inadequate for assessing PA pressure. Left Atrium: Left atrial size was normal in size. Right Atrium: Right atrial size was normal in size. Pericardium: There is no evidence of pericardial effusion. Mitral Valve: The mitral valve is normal in structure. No evidence of mitral valve regurgitation. No evidence of mitral valve stenosis. Tricuspid Valve: The tricuspid valve is normal in structure. Tricuspid valve regurgitation is not demonstrated. No evidence of tricuspid stenosis. Aortic Valve: The aortic  valve has an indeterminant number of cusps. Aortic valve regurgitation is not visualized. No aortic stenosis is present. Aortic valve mean gradient measures 3.9 mmHg. Aortic valve peak gradient measures 9.0 mmHg. Aortic valve area, by VTI measures 2.03 cm. Pulmonic Valve: The pulmonic valve was not well visualized. Pulmonic valve regurgitation is not visualized. No evidence of pulmonic stenosis. Aorta: The aortic root is normal in size and structure. Venous: The inferior vena cava is normal in size with greater than 50% respiratory variability, suggesting right atrial pressure of 3 mmHg. IAS/Shunts: No atrial level shunt detected by color flow Doppler.  LEFT VENTRICLE PLAX 2D LVIDd:         3.70 cm   Diastology LVIDs:         2.60 cm   LV e' medial:    9.25 cm/s LV PW:         1.00 cm   LV E/e' medial:  10.1 LV IVS:        0.80 cm   LV e' lateral:   11.50 cm/s LVOT diam:     1.80 cm   LV E/e' lateral: 8.1 LV SV:         56 LV SV Index:   32 LVOT Area:     2.54 cm  RIGHT VENTRICLE RV S prime:     10.10 cm/s TAPSE (M-mode): 2.2 cm LEFT ATRIUM             Index        RIGHT ATRIUM           Index LA diam:        2.70 cm 1.53 cm/m   RA Area:     11.10 cm LA Vol (A2C):   50.7 ml 28.68 ml/m  RA Volume:   24.40 ml  13.80 ml/m LA Vol (A4C):   38.7 ml 21.89 ml/m LA Biplane Vol: 45.2 ml 25.57 ml/m  AORTIC VALVE AV Area (Vmax):    1.82 cm AV Area (Vmean):   2.13 cm AV Area (VTI):     2.03 cm AV Vmax:           149.61 cm/s AV Vmean:          92.371 cm/s AV VTI:            0.276 m AV Peak Grad:      9.0 mmHg AV Mean Grad:      3.9 mmHg LVOT Vmax:  107.00 cm/s LVOT Vmean:        77.300 cm/s LVOT VTI:          0.220 m LVOT/AV VTI ratio: 0.80  AORTA Ao Root diam: 2.70 cm Ao Asc diam:  2.60 cm MITRAL VALVE MV Area (PHT): 3.61 cm    SHUNTS MV Decel Time: 210 msec    Systemic VTI:  0.22 m MV E velocity: 93.00 cm/s  Systemic Diam: 1.80 cm MV A velocity: 68.40 cm/s MV E/A ratio:  1.36 Dina Rich MD Electronically  signed by Dina Rich MD Signature Date/Time: 09/12/2021/4:56:42 PM    Final    CT HEAD CODE STROKE WO CONTRAST  Result Date: 09/11/2021 CLINICAL DATA:  Code stroke. Neuro deficit, acute, stroke suspected. Left-sided weakness. EXAM: CT HEAD WITHOUT CONTRAST TECHNIQUE: Contiguous axial images were obtained from the base of the skull through the vertex without intravenous contrast. RADIATION DOSE REDUCTION: This exam was performed according to the departmental dose-optimization program which includes automated exposure control, adjustment of the mA and/or kV according to patient size and/or use of iterative reconstruction technique. COMPARISON:  None. FINDINGS: Brain: Normal appearance without evidence of old or acute infarction, mass lesion, hemorrhage, hydrocephalus or extra-axial collection. Vascular: No calcified plaque or hyperdense vessel. Skull: Normal Sinuses/Orbits: Normal Other: None ASPECTS ([place]berta Stroke Program Early CT Score) - Ganglionic level infarction (caudate, lentiform nuclei, internal capsule, insula, M1-M3 cortex): 7 - Supraganglionic infarction (M4-M6 cortex): 3 Total score (0-10 with 10 being normal): 10 IMPRESSION: 1. Normal head CT. 2. ASPECTS is 10. 3. These results were called by telephone at the time of interpretation on 09/11/2021 at 5:47 pm to provider [facility] , who verbally acknowledged these results. Electronically Signed   By: Paulina Fusi M.D.   On: 09/11/2021 17:48   CT ANGIO HEAD NECK W WO CM (CODE STROKE)  Result Date: 09/11/2021 CLINICAL DATA:  Neuro deficit, acute, stroke suspected. Left-sided weakness. History of vertebral dissection. Ehlers-Danlos syndrome. EXAM: CT ANGIOGRAPHY HEAD AND NECK TECHNIQUE: Multidetector CT imaging of the head and neck was performed using the standard protocol during bolus administration of intravenous contrast. Multiplanar CT image reconstructions and MIPs were obtained to evaluate the vascular anatomy. Carotid stenosis  measurements (when applicable) are obtained utilizing NASCET criteria, using the distal internal carotid diameter as the denominator. RADIATION DOSE REDUCTION: This exam was performed according to the departmental dose-optimization program which includes automated exposure control, adjustment of the mA and/or kV according to patient size and/or use of iterative reconstruction technique. CONTRAST:  75mL OMNIPAQUE IOHEXOL 350 MG/ML SOLN COMPARISON:  Head CT same day FINDINGS: CTA NECK FINDINGS Aortic arch: Normal Right carotid system: Normal. No carotid dissection. No evidence of stenosis. Left carotid system: Normal.  No carotid dissection.  No stenosis. Vertebral arteries: Both vertebral artery origins are widely patent. Both vertebral arteries appear normal through the cervical region. No sign of prior dissection. No stenosis or pseudo aneurysm. Both vertebral arteries reach the basilar. No basilar stenosis. Skeleton: Normal Other neck: No mass or lymphadenopathy. Numerous small thyroid nodules or cysts, the largest measuring 7 mm. Upper chest: Normal Review of the MIP images confirms the above findings CTA HEAD FINDINGS Anterior circulation: Both internal carotid arteries are patent through the skull base and siphon regions. The anterior and middle cerebral vessels are normal without vessel occlusion, proximal stenosis, aneurysm or vascular malformation. Posterior circulation: Both vertebral arteries patent to the basilar. No basilar stenosis. Posterior circulation branch vessels are normal. Venous sinuses: Patent and normal. Anatomic  variants: None significant. Review of the MIP images confirms the above findings IMPRESSION: Normal examination. No evidence of vessel occlusion or proximal stenosis. No dissection presently. No sign of prior dissection. Numerous small cysts or nodules of the thyroid, the largest 7 mm. No followup recommended (ref: J Am Coll Radiol. 2015 Feb;12(2): 143-50). Electronically Signed    By: Paulina Fusi M.D.   On: 09/11/2021 18:22      Subjective: Continues to have some left arm weakness, although she does feel it is better than it was yesterday  Discharge Exam: Vitals:   09/12/21 0432 09/12/21 0832 09/12/21 1228 09/12/21 1601  BP: 93/66 109/71 108/66 119/80  Pulse: 79 74 84 93  Resp:  20 17 20   Temp:  98.9 F (37.2 C)  98.4 F (36.9 C)  TempSrc:  Oral  Oral  SpO2: 100% 100% 100% 100%  Weight:      Height:        General: Pt is alert, awake, not in acute distress Cardiovascular: RRR, S1/S2 +, no rubs, no gallops Respiratory: CTA bilaterally, no wheezing, no rhonchi Abdominal: Soft, NT, ND, bowel sounds + Extremities: no edema, no cyanosis    The results of significant diagnostics from this hospitalization (including imaging, microbiology, ancillary and laboratory) are listed below for reference.     Microbiology: Recent Results (from the past 240 hour(s))  Resp Panel by RT-PCR (Flu A&B, Covid) Nasopharyngeal Swab     Status: None   Collection Time: 09/11/21  7:10 PM   Specimen: Nasopharyngeal Swab; Nasopharyngeal(NP) swabs in vial transport medium  Result Value Ref Range Status   SARS Coronavirus 2 by RT PCR NEGATIVE NEGATIVE Final    Comment: (NOTE) SARS-CoV-2 target nucleic acids are NOT DETECTED.  The SARS-CoV-2 RNA is generally detectable in upper respiratory specimens during the acute phase of infection. The lowest concentration of SARS-CoV-2 viral copies this assay can detect is 138 copies/mL. A negative result does not preclude SARS-Cov-2 infection and should not be used as the sole basis for treatment or other patient management decisions. A negative result may occur with  improper specimen collection/handling, submission of specimen other than nasopharyngeal swab, presence of viral mutation(s) within the areas targeted by this assay, and inadequate number of viral copies(<138 copies/mL). A negative result must be combined with clinical  observations, patient history, and epidemiological information. The expected result is Negative.  Fact Sheet for Patients:  BloggerCourse.com  Fact Sheet for Healthcare Providers:  SeriousBroker.it  This test is no t yet approved or cleared by the [place] FDA and  has been authorized for detection and/or diagnosis of SARS-CoV-2 by FDA under an Emergency Use Authorization (EUA). This EUA will remain  in effect (meaning this test can be used) for the duration of the COVID-19 declaration under Section 564(b)(1) of the Act, 21 U.S.C.section 360bbb-3(b)(1), unless the authorization is terminated  or revoked sooner.       Influenza A by PCR NEGATIVE NEGATIVE Final   Influenza B by PCR NEGATIVE NEGATIVE Final    Comment: (NOTE) The Xpert Xpress SARS-CoV-2/FLU/RSV plus assay is intended as an aid in the diagnosis of influenza from Nasopharyngeal swab specimens and should not be used as a sole basis for treatment. Nasal washings and aspirates are unacceptable for Xpert Xpress SARS-CoV-2/FLU/RSV testing.  Fact Sheet for Patients: BloggerCourse.com  Fact Sheet for Healthcare Providers: SeriousBroker.it  This test is not yet approved or cleared by the [place] FDA and has been authorized for detection and/or diagnosis of  SARS-CoV-2 by FDA under an Emergency Use Authorization (EUA). This EUA will remain in effect (meaning this test can be used) for the duration of the COVID-19 declaration under Section 564(b)(1) of the Act, 21 U.S.C. section 360bbb-3(b)(1), unless the authorization is terminated or revoked.  Performed at [facility], [address], [place], [place] [address]      Labs: BNP (last 3 results) No results for input(s): BNP in the last 8760 hours. Basic Metabolic Panel: Recent Labs  Lab 09/11/21 1724 09/11/21 1809  NA 139 141  K 4.0 4.1  CL 108 107  CO2  23  --   GLUCOSE 90 87  BUN 10 9  CREATININE 0.94 0.90  CALCIUM 9.4  --    Liver Function Tests: Recent Labs  Lab 09/11/21 1724  AST 19  ALT 12  ALKPHOS 41  BILITOT 0.4  PROT 7.8  ALBUMIN 4.5   No results for input(s): LIPASE, AMYLASE in the last 168 hours. No results for input(s): AMMONIA in the last 168 hours. CBC: Recent Labs  Lab 09/11/21 1724 09/11/21 1809  WBC 6.3  --   NEUTROABS 3.7  --   HGB 13.0 13.6  HCT 41.0 40.0  MCV 88.7  --   PLT 271  --    Cardiac Enzymes: No results for input(s): CKTOTAL, CKMB, CKMBINDEX, TROPONINI in the last 168 hours. BNP: Invalid input(s): POCBNP CBG: Recent Labs  Lab 09/11/21 1807  GLUCAP 81   D-Dimer No results for input(s): DDIMER in the last 72 hours. Hgb A1c Recent Labs    09/12/21 0551  HGBA1C 5.1   Lipid Profile Recent Labs    09/12/21 0551  CHOL 132  HDL 48  LDLCALC 78  TRIG 30  CHOLHDL 2.8   Thyroid function studies No results for input(s): TSH, T4TOTAL, T3FREE, THYROIDAB in the last 72 hours.  Invalid input(s): FREET3 Anemia work up No results for input(s): VITAMINB12, FOLATE, FERRITIN, TIBC, IRON, RETICCTPCT in the last 72 hours. Urinalysis    Component Value Date/Time   COLORURINE STRAW (A) 09/11/2021 1850   APPEARANCEUR CLEAR 09/11/2021 1850   LABSPEC 1.031 (H) 09/11/2021 1850   PHURINE 6.0 09/11/2021 1850   GLUCOSEU NEGATIVE 09/11/2021 1850   HGBUR NEGATIVE 09/11/2021 1850   BILIRUBINUR NEGATIVE 09/11/2021 1850   KETONESUR NEGATIVE 09/11/2021 1850   PROTEINUR NEGATIVE 09/11/2021 1850   UROBILINOGEN 1.0 06/25/2014 0908   NITRITE NEGATIVE 09/11/2021 1850   LEUKOCYTESUR NEGATIVE 09/11/2021 1850   Sepsis Labs Invalid input(s): PROCALCITONIN,  WBC,  LACTICIDVEN Microbiology Recent Results (from the past 240 hour(s))  Resp Panel by RT-PCR (Flu A&B, Covid) Nasopharyngeal Swab     Status: None   Collection Time: 09/11/21  7:10 PM   Specimen: Nasopharyngeal Swab; Nasopharyngeal(NP) swabs  in vial transport medium  Result Value Ref Range Status   SARS Coronavirus 2 by RT PCR NEGATIVE NEGATIVE Final    Comment: (NOTE) SARS-CoV-2 target nucleic acids are NOT DETECTED.  The SARS-CoV-2 RNA is generally detectable in upper respiratory specimens during the acute phase of infection. The lowest concentration of SARS-CoV-2 viral copies this assay can detect is 138 copies/mL. A negative result does not preclude SARS-Cov-2 infection and should not be used as the sole basis for treatment or other patient management decisions. A negative result may occur with  improper specimen collection/handling, submission of specimen other than nasopharyngeal swab, presence of viral mutation(s) within the areas targeted by this assay, and inadequate number of viral copies(<138 copies/mL). A negative result must be  combined with clinical observations, patient history, and epidemiological information. The expected result is Negative.  Fact Sheet for Patients:  BloggerCourse.com  Fact Sheet for Healthcare Providers:  SeriousBroker.it  This test is no t yet approved or cleared by the [place] FDA and  has been authorized for detection and/or diagnosis of SARS-CoV-2 by FDA under an Emergency Use Authorization (EUA). This EUA will remain  in effect (meaning this test can be used) for the duration of the COVID-19 declaration under Section 564(b)(1) of the Act, 21 U.S.C.section 360bbb-3(b)(1), unless the authorization is terminated  or revoked sooner.       Influenza A by PCR NEGATIVE NEGATIVE Final   Influenza B by PCR NEGATIVE NEGATIVE Final    Comment: (NOTE) The Xpert Xpress SARS-CoV-2/FLU/RSV plus assay is intended as an aid in the diagnosis of influenza from Nasopharyngeal swab specimens and should not be used as a sole basis for treatment. Nasal washings and aspirates are unacceptable for Xpert Xpress  SARS-CoV-2/FLU/RSV testing.  Fact Sheet for Patients: BloggerCourse.com  Fact Sheet for Healthcare Providers: SeriousBroker.it  This test is not yet approved or cleared by the [place] FDA and has been authorized for detection and/or diagnosis of SARS-CoV-2 by FDA under an Emergency Use Authorization (EUA). This EUA will remain in effect (meaning this test can be used) for the duration of the COVID-19 declaration under Section 564(b)(1) of the Act, 21 U.S.C. section 360bbb-3(b)(1), unless the authorization is terminated or revoked.  Performed at [facility], [address] [place], [place] [address]      Time coordinating discharge:  SIGNED:   Erick Blinks, MD  [facility] 09/12/2021, 8:39 PM   If 7PM-7AM, please contact night-coverage [email or web address]

## 2021-09-12 NOTE — Progress Notes (Signed)
Patient alert, oriented, ambulatory. Patient reports some changes to vision, since start of episode, but nothing increasing. She still has some slight left sided deficits. Awaiting neurologist. Visitor at bedside. Call bell within reach.  ?

## 2021-09-12 NOTE — Progress Notes (Signed)
?  Echocardiogram ?2D Echocardiogram has been performed. ? ?Warren Lacy Gamal Todisco RDCS ?09/12/2021, 11:24 AM ?

## 2021-09-22 NOTE — Progress Notes (Signed)
Code stroke ct ?1724 call time ?1726 beeper time ?1740 exam started ?1740 exam finished ?1745 exam completed ?1745 [place] [facility] called ?
